# Patient Record
Sex: Female | Born: 1990 | Race: Asian | Hispanic: No | Marital: Single | Smoking: Never smoker
Health system: Southern US, Community
[De-identification: ages and names within clinical notes are randomized; demographics above are authoritative.]

## PROBLEM LIST (undated history)

## (undated) DIAGNOSIS — Z789 Other specified health status: Secondary | ICD-10-CM

## (undated) DIAGNOSIS — F32A Depression, unspecified: Secondary | ICD-10-CM

## (undated) DIAGNOSIS — D219 Benign neoplasm of connective and other soft tissue, unspecified: Secondary | ICD-10-CM

## (undated) DIAGNOSIS — R87629 Unspecified abnormal cytological findings in specimens from vagina: Secondary | ICD-10-CM

## (undated) DIAGNOSIS — A549 Gonococcal infection, unspecified: Secondary | ICD-10-CM

## (undated) DIAGNOSIS — A749 Chlamydial infection, unspecified: Secondary | ICD-10-CM

## (undated) DIAGNOSIS — Z202 Contact with and (suspected) exposure to infections with a predominantly sexual mode of transmission: Secondary | ICD-10-CM

## (undated) HISTORY — PX: THERAPEUTIC ABORTION: SHX798

## (undated) HISTORY — DX: Unspecified abnormal cytological findings in specimens from vagina: R87.629

## (undated) HISTORY — PX: NO PAST SURGERIES: SHX2092

## (undated) HISTORY — PX: WISDOM TOOTH EXTRACTION: SHX21

---
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
## 2020-08-06 NOTE — L&D Delivery Note (Addendum)
LABOR COURSE Patient presented to MAU after SROM at home on 9/11.  On arrival to L&D, cervical exam was 4/60/-2. She was initially managed expectantly with little change. Pitocin was started at 0730 on 9/12. She progressed to complete at 1615. She alternated laboring down and pushing for several hours but continued to progress and move baby.   Delivery Note Called to room and patient was complete and pushing. Head delivered OA. No nuchal cord present. Shoulder and body delivered in usual fashion. At [place] a healthy female was delivered via Vaginal, Spontaneous vertex presentation.  Infant with spontaneous cry, placed on mother's abdomen, dried and stimulated. Cord clamped x 2 after 1-minute delay, and cut by FOB. Cord blood drawn. Placenta delivered spontaneously with gentle cord traction. Appears intact. Fundus firm with massage and Pitocin. Labia, perineum, vagina, and cervix inspected with first degree perineal laceration which was repaired in the usual fashion.    APGAR: 8, 9; weight pending.   Cord: 3VC with the following complications:None.    Anesthesia: Nitrous oxide Episiotomy: None Lacerations: First degree perineal Suture Repair: 3.0 vicryl Est. Blood Loss (mL): 358  Mom to postpartum.  Baby to Couplet care / Skin to Skin.  Eppie Gibson, MD 04/18/21 1:17 AM    GME ATTESTATION:  I was present and gloved for the duration of the delivery and repair. I agree with the findings and the plan of care as documented in the resident's note.  Darrelyn Hillock, DO OB Fellow, [place] for [place] 04/18/2021 2:09 AM

## 2020-09-06 ENCOUNTER — Inpatient Hospital Stay (HOSPITAL_COMMUNITY): Payer: Medicaid Other

## 2020-09-06 ENCOUNTER — Other Ambulatory Visit: Payer: Self-pay

## 2020-09-06 ENCOUNTER — Inpatient Hospital Stay (HOSPITAL_COMMUNITY)
Admission: AD | Admit: 2020-09-06 | Discharge: 2020-09-06 | Disposition: A | Discharge status: Home / Self Care | Payer: Medicaid Other | Attending: Obstetrics and Gynecology | Admitting: Obstetrics and Gynecology

## 2020-09-06 ENCOUNTER — Encounter (HOSPITAL_COMMUNITY): Payer: Self-pay | Admitting: Obstetrics and Gynecology

## 2020-09-06 DIAGNOSIS — O4690 Antepartum hemorrhage, unspecified, unspecified trimester: Secondary | ICD-10-CM | POA: Diagnosis present

## 2020-09-06 DIAGNOSIS — Z3A01 Less than 8 weeks gestation of pregnancy: Secondary | ICD-10-CM | POA: Insufficient documentation

## 2020-09-06 DIAGNOSIS — O209 Hemorrhage in early pregnancy, unspecified: Secondary | ICD-10-CM | POA: Insufficient documentation

## 2020-09-06 DIAGNOSIS — Z6790 Unspecified blood type, Rh positive: Secondary | ICD-10-CM

## 2020-09-06 DIAGNOSIS — D251 Intramural leiomyoma of uterus: Secondary | ICD-10-CM | POA: Diagnosis not present

## 2020-09-06 DIAGNOSIS — O3411 Maternal care for benign tumor of corpus uteri, first trimester: Secondary | ICD-10-CM | POA: Diagnosis not present

## 2020-09-06 DIAGNOSIS — O4691 Antepartum hemorrhage, unspecified, first trimester: Secondary | ICD-10-CM

## 2020-09-06 DIAGNOSIS — Z3490 Encounter for supervision of normal pregnancy, unspecified, unspecified trimester: Secondary | ICD-10-CM

## 2020-09-06 HISTORY — DX: Other specified health status: Z78.9

## 2020-09-06 LAB — WET PREP, GENITAL
Clue Cells Wet Prep HPF POC: NONE SEEN
Sperm: NONE SEEN
Trich, Wet Prep: NONE SEEN
Yeast Wet Prep HPF POC: NONE SEEN

## 2020-09-06 LAB — CBC
HCT: 40.7 % (ref 36.0–46.0)
Hemoglobin: 13.2 g/dL (ref 12.0–15.0)
MCH: 28.6 pg (ref 26.0–34.0)
MCHC: 32.4 g/dL (ref 30.0–36.0)
MCV: 88.3 fL (ref 80.0–100.0)
Platelets: 251 10*3/uL (ref 150–400)
RBC: 4.61 MIL/uL (ref 3.87–5.11)
RDW: 13.6 % (ref 11.5–15.5)
WBC: 7.3 10*3/uL (ref 4.0–10.5)
nRBC: 0 % (ref 0.0–0.2)

## 2020-09-06 LAB — HCG, QUANTITATIVE, PREGNANCY: hCG, Beta Chain, Quant, S: 12768 m[IU]/mL — ABNORMAL HIGH (ref <5)

## 2020-09-06 LAB — URINALYSIS, ROUTINE W REFLEX MICROSCOPIC
Bilirubin Urine: NEGATIVE
Glucose, UA: NEGATIVE mg/dL
Hgb urine dipstick: NEGATIVE
Ketones, ur: NEGATIVE mg/dL
Leukocytes,Ua: NEGATIVE
Nitrite: NEGATIVE
Protein, ur: NEGATIVE mg/dL
Specific Gravity, Urine: 1.015 (ref 1.005–1.030)
pH: 6.5 (ref 5.0–8.0)

## 2020-09-06 LAB — ABO/RH: ABO/RH(D): O POS

## 2020-09-06 NOTE — Discharge Instructions (Signed)
Vaginal Bleeding During Pregnancy, First Trimester A small amount of bleeding from the vagina is common during early pregnancy. This kind of bleeding is also called spotting. Sometimes the bleeding is normal and does not cause problems. At other times, though, bleeding may be a sign of something serious. Normal bleeding in pregnancy can happen:  When the fertilized egg attaches itself to your womb.  When blood vessels change because of the pregnancy.  When you have pelvic exams.  When you have sex. Abnormal bleeding can happen:  When you have an infection.  When you have growths in your womb. The growths are called polyps.  If you are having a miscarriage or at risk of having one.  If you have other problems in your pregnancy. Tell your doctor right away about any bleeding from your vagina. Follow these instructions at home: Watch your bleeding  Watch your condition for any changes. Let your doctor know if you are worried about something.  Try to know what causes your bleeding. Ask yourself these questions: ? Does the bleeding start on its own? ? Does the bleeding start after something is done, such as sex or a pelvic exam?  Use a diary to write the things you see about your bleeding. Write in your diary: ? If the bleeding flows freely without stopping, or if it starts and stops, and then starts again. ? If the bleeding is heavy or light. ? How many pads you use in a day and how much blood is in them.  Tell your doctor if you pass tissue. He or she may want to see it.   Activity  Follow your doctor's instructions about how active you can be. Ask what activities are safe for you.  Do not have sex or orgasms until your doctor says that this is safe.  If needed, make plans for someone to help with your normal activities. General instructions  Take over-the-counter and prescription medicines only as told by your doctor.  Do not take aspirin because it can cause  bleeding.  Do not use tampons.  Do not douche.  Keep all follow-up visits. Contact a doctor if:  You have vaginal bleeding at any time while you are pregnant.  You have cramps.  You have a fever or chills. Get help right away if:  You have very bad cramps in your back or belly (abdomen).  You pass large clots or a lot of tissue from your vagina.  Your bleeding gets worse.  You feel light-headed.  You feel weak.  You pass out (faint).  You have chills.  You are leaking fluid from your vagina.  You have a gush of fluid from your vagina. Summary  Sometimes vaginal bleeding during pregnancy is normal and does not cause problems. At other times, bleeding may be a sign of something serious.  Tell your doctor right away about any bleeding from your vagina.  Follow your doctor's instructions about how active you can be. You may need someone to help you with your normal activities.  Keep all follow-up visits. This information is not intended to replace advice given to you by your health care provider. Make sure you discuss any questions you have with your health care provider. Document Revised: 04/14/2020 Document Reviewed: 04/14/2020 Elsevier Patient Education  2021 Elsevier Inc.  

## 2020-09-06 NOTE — MAU Note (Addendum)
Presents for evaluation of vaginal itching & odor.  States she has yeast infection and BV.  States has taken medication, but "no cure" Proof of pregnancy letter from [facility].

## 2020-09-06 NOTE — MAU Provider Note (Addendum)
History     CSN: [id number]  Arrival date and time: 09/06/20 1654  Chief Complaint  Patient presents with  . Vaginal Itching  . Vaginal Discharge  . Vaginal Odor   30 y.o. G1 @[redacted]w[redacted]d  by LMP presenting with spotting. She saw pink blood when she wiped around 7am today. No bleeding since. Denies abdominal pain. No recent sex.    OB History    Gravida  1   Para      Term      Preterm      AB      Living        SAB      IAB      Ectopic      Multiple      Live Births              Past Medical History:  Diagnosis Date  . Medical history non-contributory     Past Surgical History:  Procedure Laterality Date  . NO PAST SURGERIES      History reviewed. No pertinent family history.  Social History   Tobacco Use  . Smoking status: Never Smoker  . Smokeless tobacco: Never Used  Substance Use Topics  . Alcohol use: Never  . Drug use: Never    Allergies: No Known Allergies  Medications Prior to Admission  Medication Sig Dispense Refill Last Dose  . Prenatal Vit-Fe Fumarate-FA (PRENATAL VITAMINS PO) Take 1 tablet by mouth daily.   09/06/2020 at Unknown time    Review of Systems  Gastrointestinal: Negative for abdominal pain.  Genitourinary: Positive for vaginal bleeding.   Physical Exam   Blood pressure 115/79, pulse 85, temperature 98.3 F (36.8 C), temperature source Oral, resp. rate 19, height 5\' 2"  (1.575 m), weight 64.2 kg, last menstrual period 07/28/2020, SpO2 100 %.  Physical Exam Vitals and nursing note reviewed. Exam conducted with a chaperone present.  Constitutional:      General: She is not in acute distress.    Appearance: Normal appearance.  HENT:     Head: Normocephalic and atraumatic.  Cardiovascular:     Rate and Rhythm: Normal rate.  Pulmonary:     Effort: Pulmonary effort is normal. No respiratory distress.  Musculoskeletal:     Cervical back: Normal range of motion.  Neurological:     General: No focal deficit present.      Mental Status: She is alert and oriented to person, place, and time.  Psychiatric:        Mood and Affect: Mood normal.        Behavior: Behavior normal.    Results for orders placed or performed during the hospital encounter of 09/06/20 (from the past 24 hour(s))  Urinalysis, Routine w reflex microscopic Urine, Clean Catch     Status: None   Collection Time: 09/06/20  5:19 PM  Result Value Ref Range   Color, Urine YELLOW YELLOW   APPearance CLEAR CLEAR   Specific Gravity, Urine 1.015 1.005 - 1.030   pH 6.5 5.0 - 8.0   Glucose, UA NEGATIVE NEGATIVE mg/dL   Hgb urine dipstick NEGATIVE NEGATIVE   Bilirubin Urine NEGATIVE NEGATIVE   Ketones, ur NEGATIVE NEGATIVE mg/dL   Protein, ur NEGATIVE NEGATIVE mg/dL   Nitrite NEGATIVE NEGATIVE   Leukocytes,Ua NEGATIVE NEGATIVE  ABO/Rh     Status: None   Collection Time: 09/06/20  6:24 PM  Result Value Ref Range   ABO/RH(D) O POS    No rh immune globuloin  NOT A RH IMMUNE GLOBULIN CANDIDATE, PT RH POSITIVE Performed at [facility], [place] [address] [place], [place] [address]   CBC     Status: None   Collection Time: 09/06/20  6:24 PM  Result Value Ref Range   WBC 7.3 4.0 - 10.5 K/uL   RBC 4.61 3.87 - 5.11 MIL/uL   Hemoglobin 13.2 12.0 - 15.0 g/dL   HCT 40.7 36.0 - 46.0 %   MCV 88.3 80.0 - 100.0 fL   MCH 28.6 26.0 - 34.0 pg   MCHC 32.4 30.0 - 36.0 g/dL   RDW 13.6 11.5 - 15.5 %   Platelets 251 150 - 400 K/uL   nRBC 0.0 0.0 - 0.2 %  Wet prep, genital     Status: Abnormal   Collection Time: 09/06/20  7:05 PM  Result Value Ref Range   Yeast Wet Prep HPF POC NONE SEEN NONE SEEN   Trich, Wet Prep NONE SEEN NONE SEEN   Clue Cells Wet Prep HPF POC NONE SEEN NONE SEEN   WBC, Wet Prep HPF POC MANY (A) NONE SEEN   Sperm NONE SEEN     US OB LESS THAN 14 WEEKS WITH OB TRANSVAGINAL  Result Date: 09/06/2020 CLINICAL DATA:  Initial evaluation for acute vaginal bleeding, early pregnancy. EXAM: OBSTETRIC <14 WK [place] AND TRANSVAGINAL  OB US TECHNIQUE: Both transabdominal and transvaginal ultrasound examinations were performed for complete evaluation of the gestation as well as the maternal uterus, adnexal regions, and pelvic cul-de-sac. Transvaginal technique was performed to assess early pregnancy. COMPARISON:  None. FINDINGS: Intrauterine gestational sac: Single. Sac is somewhat irregular with a few angulated margins. Yolk sac:  Present. Embryo:  Negative. Cardiac Activity: Negative. Heart Rate: N/A MSD: 9 mm mm   5 w   5 d Subchorionic hemorrhage:  None visualized. Maternal uterus/adnexae: Ovaries within normal limits bilaterally. Small degenerating corpus luteal cyst noted within the left ovary. Intramural fibroid centered at the uterine fundus measures 3.9 x 3.8 x 3.6 cm. IMPRESSION: 1. Early intrauterine gestational sac with internal yolk sac, but no fetal pole or cardiac activity yet visualized. Recommend follow-up quantitative B-HCG levels and follow-up US in 14 days to confirm and assess viability. 2. 3.9 cm intramural fibroid at the uterine fundus. 3. Small degenerating right ovarian corpus luteal cyst. No other acute maternal uterine or adnexal abnormality. Electronically Signed   By: Jeannine Boga M.D.   On: 09/06/2020 18:54   MAU Course  Procedures  MDM Labs and [place] ordered and reviewed. Irregular IUGS and YS present, no FP. Discussed findings with pt, recommend f/u US in 2 weeks. No signs of yeast or BV, GC pending. Stable for discharge home.  Assessment and Plan   1. Early stage of pregnancy   2. Vaginal bleeding in pregnancy   3. Blood type, Rh positive    Discharge home Follow up at [facility] [place] in 2 weeks for [place] SAB precautions Pelvic rest  Allergies as of 09/06/2020   No Known Allergies     Medication List    TAKE these medications   PRENATAL VITAMINS PO Take 1 tablet by mouth daily.      Julianne Handler, CNM 09/06/2020, 7:38 PM

## 2020-09-06 NOTE — MAU Note (Signed)
Last Wednesday had pregnancy with home pregnancy test. Today after BM pt noticed pinkish re discharge on toilet tissue. Pt states she has BV and [facility] Prescribed medication that she completed but states she is still having itching and foul odor. Pt denies uterine cramping. Concerned that she is miscarrying.Marland Kitchen

## 2020-09-07 LAB — GC/CHLAMYDIA PROBE AMP (~~LOC~~) NOT AT ARMC
Chlamydia: NEGATIVE
Comment: NEGATIVE
Comment: NORMAL
Neisseria Gonorrhea: NEGATIVE

## 2020-09-21 ENCOUNTER — Encounter: Payer: Self-pay | Admitting: Medical

## 2020-09-21 ENCOUNTER — Telehealth: Payer: Self-pay | Admitting: Medical

## 2020-09-21 ENCOUNTER — Other Ambulatory Visit: Payer: Self-pay

## 2020-09-21 ENCOUNTER — Ambulatory Visit
Admission: RE | Admit: 2020-09-21 | Discharge: 2020-09-21 | Disposition: A | Discharge status: Home / Self Care | Payer: Medicaid Other | Source: Ambulatory Visit | Attending: Certified Nurse Midwife | Admitting: Certified Nurse Midwife

## 2020-09-21 DIAGNOSIS — D259 Leiomyoma of uterus, unspecified: Secondary | ICD-10-CM | POA: Insufficient documentation

## 2020-09-21 DIAGNOSIS — O4690 Antepartum hemorrhage, unspecified, unspecified trimester: Secondary | ICD-10-CM | POA: Diagnosis not present

## 2020-09-21 DIAGNOSIS — O3410 Maternal care for benign tumor of corpus uteri, unspecified trimester: Secondary | ICD-10-CM | POA: Insufficient documentation

## 2020-09-21 HISTORY — DX: Leiomyoma of uterus, unspecified: D25.9

## 2020-09-21 NOTE — Telephone Encounter (Signed)
I called Heather Brock today at 4:45 PM and confirmed patient's identity using two patient identifiers. [place] results from earlier today were reviewed. Patient is scheduled for new OB visit at [facility] on 10/04/20. First trimester warning signs reviewed. Patient voiced understanding and had no further questions.   US OB Transvaginal  Result Date: 09/21/2020 CLINICAL DATA:  30 year old pregnant female with vaginal bleeding, presenting for assessment viability. Quantitative beta HCG 12,768 on 09/06/2020. EDC by LMP: 05/04/2021, projecting to an expected gestational age of [redacted] weeks 6 days. EXAM: TRANSVAGINAL OB ULTRASOUND TECHNIQUE: Transvaginal ultrasound was performed for complete evaluation of the gestation as well as the maternal uterus, adnexal regions, and pelvic cul-de-sac. COMPARISON:  09/06/2020 obstetric scan. FINDINGS: Intrauterine gestational sac: Single Yolk sac:  Visualized. Embryo:  Visualized. Cardiac Activity: Visualized. Heart Rate: 150 bpm CRL:   12.6 mm   7 w 4 d                  [place] EDC: 05/06/2021 Subchorionic hemorrhage:  None visualized. Maternal uterus/adnexae: Anteverted uterus with 4.4 x 3.3 x 4.2 cm intramural posterior fundal fibroid. Right ovary measures 3.0 x 1.4 x 2.7 cm and is normal. Left ovary measures 3.9 x 2.0 x 2.7 cm and contains a corpus luteum. No abnormal adnexal masses. No abnormal free fluid. IMPRESSION: 1. Single living intrauterine gestation at 7 weeks 4 days by crown-rump length, concordant with provided menstrual dating. No acute first-trimester gestational abnormality. 2. Intramural 4.4 cm posterior fundal uterine fibroid. Electronically Signed   By: Ilona Sorrel M.D.   On: 09/21/2020 13:45    Kerry Hough, PA-C 09/21/2020 4:45 PM

## 2020-10-17 ENCOUNTER — Other Ambulatory Visit: Payer: Self-pay

## 2020-10-17 ENCOUNTER — Encounter (HOSPITAL_COMMUNITY): Payer: Self-pay | Admitting: Obstetrics and Gynecology

## 2020-10-17 ENCOUNTER — Inpatient Hospital Stay (HOSPITAL_COMMUNITY)
Admission: AD | Admit: 2020-10-17 | Discharge: 2020-10-17 | Disposition: A | Discharge status: Home / Self Care | Payer: Medicaid Other | Attending: Obstetrics and Gynecology | Admitting: Obstetrics and Gynecology

## 2020-10-17 DIAGNOSIS — O4691 Antepartum hemorrhage, unspecified, first trimester: Secondary | ICD-10-CM

## 2020-10-17 DIAGNOSIS — O209 Hemorrhage in early pregnancy, unspecified: Secondary | ICD-10-CM | POA: Insufficient documentation

## 2020-10-17 DIAGNOSIS — D259 Leiomyoma of uterus, unspecified: Secondary | ICD-10-CM | POA: Diagnosis not present

## 2020-10-17 DIAGNOSIS — O3411 Maternal care for benign tumor of corpus uteri, first trimester: Secondary | ICD-10-CM | POA: Insufficient documentation

## 2020-10-17 DIAGNOSIS — B9689 Other specified bacterial agents as the cause of diseases classified elsewhere: Secondary | ICD-10-CM | POA: Insufficient documentation

## 2020-10-17 DIAGNOSIS — M549 Dorsalgia, unspecified: Secondary | ICD-10-CM

## 2020-10-17 DIAGNOSIS — Z3A11 11 weeks gestation of pregnancy: Secondary | ICD-10-CM

## 2020-10-17 DIAGNOSIS — N760 Acute vaginitis: Secondary | ICD-10-CM

## 2020-10-17 DIAGNOSIS — O23591 Infection of other part of genital tract in pregnancy, first trimester: Secondary | ICD-10-CM | POA: Insufficient documentation

## 2020-10-17 DIAGNOSIS — Z3491 Encounter for supervision of normal pregnancy, unspecified, first trimester: Secondary | ICD-10-CM

## 2020-10-17 DIAGNOSIS — O3410 Maternal care for benign tumor of corpus uteri, unspecified trimester: Secondary | ICD-10-CM

## 2020-10-17 DIAGNOSIS — O99891 Other specified diseases and conditions complicating pregnancy: Secondary | ICD-10-CM

## 2020-10-17 HISTORY — DX: Benign neoplasm of connective and other soft tissue, unspecified: D21.9

## 2020-10-17 HISTORY — DX: Gonococcal infection, unspecified: A54.9

## 2020-10-17 HISTORY — DX: Depression, unspecified: F32.A

## 2020-10-17 HISTORY — DX: Chlamydial infection, unspecified: A74.9

## 2020-10-17 HISTORY — DX: Contact with and (suspected) exposure to infections with a predominantly sexual mode of transmission: Z20.2

## 2020-10-17 LAB — URINALYSIS, ROUTINE W REFLEX MICROSCOPIC
Bilirubin Urine: NEGATIVE
Glucose, UA: NEGATIVE mg/dL
Ketones, ur: NEGATIVE mg/dL
Leukocytes,Ua: NEGATIVE
Nitrite: NEGATIVE
Protein, ur: NEGATIVE mg/dL
Specific Gravity, Urine: 1.019 (ref 1.005–1.030)
pH: 7 (ref 5.0–8.0)

## 2020-10-17 LAB — WET PREP, GENITAL
Sperm: NONE SEEN
Trich, Wet Prep: NONE SEEN
Yeast Wet Prep HPF POC: NONE SEEN

## 2020-10-17 MED ORDER — METRONIDAZOLE 500 MG PO TABS: 500.0000 mg | ORAL_TABLET | Freq: Two times a day (BID) | ORAL | 0 refills | Status: DC

## 2020-10-17 NOTE — MAU Note (Signed)
Was here in Feb for bleeding, started again today.(picture of blood in toilet).  Had sex this morning prior to the bleeding.  Has a d/c with odor.  Has pain in low back, hurts when she sits a long time, hurts to walk.

## 2020-10-17 NOTE — Discharge Instructions (Signed)
Vaginal Bleeding During Pregnancy, First Trimester A small amount of bleeding from the vagina, or spotting, is common during early pregnancy. Some bleeding may be related to the pregnancy, and some may not. In many cases, the bleeding is normal and is not a problem. However, bleeding can also be a sign of something serious. Normal things that may cause bleeding during the first trimester:  Implantation of the fertilized egg in the lining of the uterus.  Rapid changes in blood vessels. This is caused by changes that are happening to the body during pregnancy.  Sex.  Pelvic exams. Abnormal things that may cause bleeding during the first trimester include:  Infection or inflammation of the cervix.  Growths or polyps on the cervix.  Miscarriage or threatened miscarriage.  Pregnancy that is growing outside of the uterus (ectopic pregnancy).  A fertilized egg that becomes a mass of tissue (molar pregnancy). Tell your health care provider right away if there is any bleeding from your vagina. Follow these instructions at home: Monitoring your bleeding Monitor your bleeding.  Pay attention to any changes in your symptoms. Let your health care provider know about any concerns.  Try to understand when the bleeding occurs. Does the bleeding start on its own, or does it start after something is done, such as sex or a pelvic exam?  Use a diary to record the things you see about your bleeding, including: ? The kind of bleeding you are having. Does the bleeding start and stop irregularly, or is it a constant flow? ? The severity of your bleeding. Is the bleeding heavy or light? ? The number of pads you use each day, how often you change them, and how soaked they are.  Tell your health care provider if you pass tissue. He or she may want to see it.   Activity  Follow instructions from your health care provider about limiting your activity. Ask what activities are safe for you.  Do not have  sex until your health care provider says that this is safe.  If needed, make plans for someone to help with your regular activities. General instructions  Take over-the-counter and prescription medicines only as told by your health care provider.  Do not take aspirin because it can cause bleeding.  Do not use tampons or douche.  Keep all follow-up visits. This is important. Contact a health care provider if:  You have vaginal bleeding during any part of your pregnancy.  You have cramps or labor pains.  You have a fever or chills. Get help right away if:  You have severe cramps in your back or abdomen.  You pass large clots or a large amount of tissue from your vagina.  Your bleeding increases.  You feel light-headed or weak, or you faint.  You are leaking fluid or have a gush of fluid from your vagina. Summary  A small amount of bleeding from the vagina is common during early pregnancy.  Be sure to tell your health care provider about any vaginal bleeding right away.  Try to understand when bleeding occurs. Does bleeding occur on its own, or does it occur after something is done, such as sex or pelvic exams?  Keep all follow-up visits. This is important. This information is not intended to replace advice given to you by your health care provider. Make sure you discuss any questions you have with your health care provider. Document Revised: 04/14/2020 Document Reviewed: 04/14/2020 Elsevier Patient Education  2021 Elsevier Inc.  

## 2020-10-17 NOTE — MAU Provider Note (Signed)
History     CSN: [id number]  Arrival date and time: 10/17/20 1152   Event Date/Time   First Provider Initiated Contact with Patient 10/17/20 1347      Chief Complaint  Patient presents with  . Vaginal Bleeding  . Back Pain  . Vaginal Discharge   HPI Heather Brock is a 30 y.o. G3P0020 at [redacted]w[redacted]d who presents with vaginal bleeding and back pain. Reports vaginal bleeding started this morning after intercourse. Dark red blood in the toilet. Not saturating pads or passing clots. Also complains of low back pain. Pain is worse with prolonged sitting and walking. Rates pain 7/10. Hasn't treated symptoms. Also reports a foul smelling mucoid discharge. No vaginal itching or irritation. Goes to [facility] for prenatal care.   OB History    Gravida  3   Para      Term      Preterm      AB  2   Living  0     SAB      IAB  2   Ectopic      Multiple      Live Births  0           Past Medical History:  Diagnosis Date  . Medical history non-contributory     Past Surgical History:  Procedure Laterality Date  . NO PAST SURGERIES      No family history on file.  Social History   Tobacco Use  . Smoking status: Never Smoker  . Smokeless tobacco: Never Used  Substance Use Topics  . Alcohol use: Never  . Drug use: Never    Allergies:  Allergies  Allergen Reactions  . Latex     Irritation with condoms    Medications Prior to Admission  Medication Sig Dispense Refill Last Dose  . Prenatal Vit-Fe Fumarate-FA (PRENATAL VITAMINS PO) Take 1 tablet by mouth daily.       Review of Systems  Constitutional: Negative.   Gastrointestinal: Negative.   Genitourinary: Positive for vaginal bleeding and vaginal discharge. Negative for dysuria and flank pain.  Musculoskeletal: Positive for back pain.   Physical Exam   Blood pressure 120/78, pulse 91, temperature 98.1 F (36.7 C), temperature source Oral, resp. rate 18, height 5\' 2"  (1.575 m), weight 64.7 kg, last menstrual period  07/28/2020, SpO2 99 %.  Physical Exam Vitals and nursing note reviewed. Exam conducted with a chaperone present.  Constitutional:      General: She is not in acute distress.    Appearance: Normal appearance.  HENT:     Head: Normocephalic and atraumatic.  Pulmonary:     Effort: Pulmonary effort is normal. No respiratory distress.  Abdominal:     General: Abdomen is flat.     Tenderness: There is no abdominal tenderness.  Genitourinary:    General: Normal vulva.     Cervix: No cervical motion tenderness.     Comments: Small amount of dark red blood. Cervix closed.  Neurological:     Mental Status: She is alert.     MAU Course  Procedures Results for orders placed or performed during the hospital encounter of 10/17/20 (from the past 24 hour(s))  Urinalysis, Routine w reflex microscopic Urine, Clean Catch     Status: Abnormal   Collection Time: 10/17/20 12:30 PM  Result Value Ref Range   Color, Urine YELLOW YELLOW   APPearance HAZY (A) CLEAR   Specific Gravity, Urine 1.019 1.005 - 1.030   pH 7.0 5.0 - 8.0  Glucose, UA NEGATIVE NEGATIVE mg/dL   Hgb urine dipstick SMALL (A) NEGATIVE   Bilirubin Urine NEGATIVE NEGATIVE   Ketones, ur NEGATIVE NEGATIVE mg/dL   Protein, ur NEGATIVE NEGATIVE mg/dL   Nitrite NEGATIVE NEGATIVE   Leukocytes,Ua NEGATIVE NEGATIVE   WBC, UA 0-5 0 - 5 WBC/hpf   Bacteria, UA RARE (A) NONE SEEN   Squamous Epithelial / LPF 0-5 0 - 5   Mucus PRESENT   Wet prep, genital     Status: Abnormal   Collection Time: 10/17/20  1:55 PM   Specimen: Vaginal  Result Value Ref Range   Yeast Wet Prep HPF POC NONE SEEN NONE SEEN   Trich, Wet Prep NONE SEEN NONE SEEN   Clue Cells Wet Prep HPF POC PRESENT (A) NONE SEEN   WBC, Wet Prep HPF POC MANY (A) NONE SEEN   Sperm NONE SEEN     MDM FHT present via doppler. Minimal bleeding on exam & cervix closed.  Wet prep & GC/CT collected per patient request & due to new onset of malodorous discharge. Wet prep positive for  clue cells, will tx with flagyl.    Assessment and Plan   1. Vaginal bleeding in pregnancy, first trimester   2. Uterine fibroid during pregnancy, antepartum   3. Fetal heart tones present, first trimester   4. Bacterial vaginosis   5. [redacted] weeks gestation of pregnancy    -bleeding precautions -pelvic rest -rx flagyl -GC/CT & urine culture pending  Jorje Guild 10/17/2020, 1:48 PM

## 2020-10-18 LAB — CULTURE, OB URINE: Culture: <10000 — AB

## 2020-10-18 LAB — GC/CHLAMYDIA PROBE AMP (~~LOC~~) NOT AT ARMC
Chlamydia: NEGATIVE
Comment: NEGATIVE
Comment: NORMAL
Neisseria Gonorrhea: NEGATIVE

## 2020-10-21 ENCOUNTER — Inpatient Hospital Stay (HOSPITAL_COMMUNITY)
Admission: AD | Admit: 2020-10-21 | Discharge: 2020-10-21 | Disposition: A | Discharge status: Home / Self Care | Payer: Medicaid Other | Attending: Obstetrics and Gynecology | Admitting: Obstetrics and Gynecology

## 2020-10-21 ENCOUNTER — Other Ambulatory Visit: Payer: Self-pay

## 2020-10-21 ENCOUNTER — Encounter (HOSPITAL_COMMUNITY): Payer: Self-pay | Admitting: Obstetrics and Gynecology

## 2020-10-21 DIAGNOSIS — O99891 Other specified diseases and conditions complicating pregnancy: Secondary | ICD-10-CM

## 2020-10-21 DIAGNOSIS — O4691 Antepartum hemorrhage, unspecified, first trimester: Secondary | ICD-10-CM

## 2020-10-21 DIAGNOSIS — Z3A12 12 weeks gestation of pregnancy: Secondary | ICD-10-CM

## 2020-10-21 DIAGNOSIS — N760 Acute vaginitis: Secondary | ICD-10-CM

## 2020-10-21 DIAGNOSIS — Z3491 Encounter for supervision of normal pregnancy, unspecified, first trimester: Secondary | ICD-10-CM

## 2020-10-21 DIAGNOSIS — B9689 Other specified bacterial agents as the cause of diseases classified elsewhere: Secondary | ICD-10-CM

## 2020-10-21 DIAGNOSIS — N939 Abnormal uterine and vaginal bleeding, unspecified: Secondary | ICD-10-CM

## 2020-10-21 DIAGNOSIS — F419 Anxiety disorder, unspecified: Secondary | ICD-10-CM

## 2020-10-21 DIAGNOSIS — O99341 Other mental disorders complicating pregnancy, first trimester: Secondary | ICD-10-CM

## 2020-10-21 NOTE — MAU Provider Note (Signed)
Event Date/Time   First Provider Initiated Contact with Patient 10/21/20 1024     S [name] is a 30 y.o. G52P0020 pregnant female at [redacted]w[redacted]d who presents to MAU today with complaint of continued vaginal spotting - only when she wipes after using the bathroom. She was seen on Monday for the same thing but had +FHT and diagnosed with BV. Pt says she was told to come back in a week if bleeding had stopped, MAU note states bleeding precautions given. Had appt at [facility] for initial OB, but was unable to make it due to transportation issues. Expresses anxiety r/t family problems and continued spotting. Did not begin taking flagyl for BV until Wednesday and not taking it consistently. Stated "I keep making myself crazy because every time I see blood I Google 'signs of miscarriage' and then I called the office and they sent me here." Denies nausea/vomiting/diarrhea, cramping or other physical symptoms.  O BP 117/75 (BP Location: Right Arm)   Pulse 89   Temp 98.4 F (36.9 C) (Oral)   Resp 18   Ht 5\' 2"  (1.575 m)   Wt 139 lb 6.4 oz (63.2 kg)   LMP 07/28/2020   SpO2 100%   BMI 25.50 kg/m  Physical Exam Vitals and nursing note reviewed.  Constitutional:      General: She is not in acute distress.    Appearance: She is not ill-appearing.  HENT:     Mouth/Throat:     Mouth: Mucous membranes are moist.  Eyes:     Pupils: Pupils are equal, round, and reactive to light.  Cardiovascular:     Rate and Rhythm: Normal rate.     Pulses: Normal pulses.  Pulmonary:     Effort: Pulmonary effort is normal.  Musculoskeletal:        General: Normal range of motion.  Skin:    General: Skin is warm and dry.     Capillary Refill: Capillary refill takes less than 2 seconds.  Neurological:     Mental Status: She is alert and oriented to person, place, and time.  Psychiatric:        Behavior: Behavior normal.        Thought Content: Thought content normal.        Judgment: Judgment normal.     Comments:  Anxious/nervous    FHR: 163  - RN found FHT and then I had pt record a video of the FHR so she can watch the video instead of Googling.  A Vaginal spotting r/t bacterial vaginosis Fetal heart tones present at 12 weeks of gestation  P Discharge from MAU in stable condition with bleeding precautions  - reinforced that pt should not follow up here unless soaking a pad, severe abdominal pain, etc Encouraged pt to seek care at [facility] so she can take advantage of on-site [place], [employer], and Film/video editor. Sent message to admin for scheduling assistance.  Gabriel Carina, [place] 10/21/2020 10:51 AM

## 2020-10-21 NOTE — Discharge Instructions (Signed)
Bacterial Vaginosis  Bacterial vaginosis is an infection of the vagina. It happens when too many normal germs (healthy bacteria) grow in the vagina. This infection can make it easier to get other infections from sex (STIs). It is very important for pregnant women to get treated. This infection can cause babies to be born early or at a low birth weight. What are the causes? This infection is caused by an increase in certain germs that grow in the vagina. You cannot get this infection from toilet seats, bedsheets, swimming pools, or things that touch your vagina. What increases the risk?  Having sex with a new person or more than one person.  Having sex without protection.  Douching.  Having an intrauterine device (IUD).  Smoking.  Using drugs or drinking alcohol. These can lead you to do things that are risky.  Taking certain antibiotic medicines.  Being pregnant. What are the signs or symptoms? Some women have no symptoms. Symptoms may include:  A discharge from your vagina. It may be gray or white. It can be watery or foamy.  A fishy smell. This can happen after sex or during your menstrual period.  Itching in and around your vagina.  A feeling of burning or pain when you pee (urinate). How is this treated? This infection is treated with antibiotic medicines. These may be given to you as:  A pill.  A cream for your vagina.  A medicine that you put into your vagina (suppository). If the infection comes back after treatment, you may need more antibiotics. Follow these instructions at home: Medicines  Take over-the-counter and prescription medicines as told by your doctor.  Take or use your antibiotic medicine as told by your doctor. Do not stop taking or using it, even if you start to feel better. General instructions  If the person you have sex with is a woman, tell her that you have this infection. She will need to follow up with her doctor. If you have a female  partner, he does not need to be treated.  Do not have sex until you finish treatment.  Drink enough fluid to keep your pee pale yellow.  Keep your vagina and butt clean. ? Wash the area with warm water each day. ? Wipe from front to back after you use the toilet.  If you are breastfeeding a baby, ask your doctor if you should keep doing so during treatment.  Keep all follow-up visits. How is this prevented? Self-care  Do not douche.  Use only warm water to wash around your vagina.  Wear underwear that is cotton or lined with cotton.  Do not wear tight pants and pantyhose, especially in the summer. Safe sex  Use protection when you have sex. This includes: ? Use condoms. ? Use dental dams. This is a thin layer that protects the mouth during oral sex.  Limit how many people you have sex with. To prevent this infection, it is best to have sex with just one person.  Get tested for STIs. The person you have sex with should also get tested. Drugs and alcohol  Do not smoke or use any products that contain nicotine or tobacco. If you need help quitting, ask your doctor.  Do not use drugs.  Do not drink alcohol if: ? Your doctor tells you not to drink. ? You are pregnant, may be pregnant, or are planning to become pregnant.  If you drink alcohol: ? Limit how much you have to 0-1 drink   a day. ? Know how much alcohol is in your drink. In the [place] one drink equals one 12 oz bottle of beer (355 mL), one 5 oz glass of wine (148 mL), or one 1 oz glass of hard liquor (44 mL). Where to find more information  Centers for Disease Control and Prevention: www.cdc.gov  American Sexual Health Association: www.ashastd.org  Office on Women's Health: www.womenshealth.gov Contact a doctor if:  Your symptoms do not get better, even after you are treated.  You have more discharge or pain when you pee.  You have a fever or chills.  You have pain in your belly (abdomen) or in the area  between your hips.  You have pain with sex.  You bleed from your vagina between menstrual periods. Summary  This infection can happen when too many germs (bacteria) grow in the vagina.  This infection can make it easier to get infections from sex (STIs). Treating this can lower that chance.  Get treated if you are pregnant. This infection can cause babies to be born early.  Do not stop taking or using your antibiotic medicine, even if you start to feel better. This information is not intended to replace advice given to you by your health care provider. Make sure you discuss any questions you have with your health care provider. Document Revised: 01/21/2020 Document Reviewed: 01/21/2020 Elsevier Patient Education  2021 [employer] Inc.  

## 2020-10-21 NOTE — MAU Note (Signed)
Presents with c/o VB and passing that began Monday, states was seen in MAU on Monday and instructed to return in 1 week if VB continued.  Reports no longer passing clots, but spotting continues.  Currently being treated for BV, started taking it Wednesday.

## 2020-11-11 ENCOUNTER — Ambulatory Visit: Payer: Self-pay | Admitting: Clinical

## 2020-11-11 DIAGNOSIS — Z5329 Procedure and treatment not carried out because of patient's decision for other reasons: Secondary | ICD-10-CM

## 2020-11-11 DIAGNOSIS — Z91199 Patient's noncompliance with other medical treatment and regimen due to unspecified reason: Secondary | ICD-10-CM

## 2020-11-11 NOTE — BH Specialist Note (Addendum)
Pt did not arrive to video visit and did not answer the phone ;Left HIPPA-compliant message to call back Roselyn Reef from [facility] [employer] [facility] at [phone] (main office) or [phone] ([place] office). ; could not leave MyChart message for patient.   Heather Brock (Supervisor: Vesta Mixer)

## 2020-11-14 ENCOUNTER — Encounter: Payer: Medicaid Other | Admitting: Obstetrics and Gynecology

## 2020-11-17 ENCOUNTER — Encounter: Payer: Self-pay | Admitting: Obstetrics and Gynecology

## 2020-11-17 ENCOUNTER — Other Ambulatory Visit (HOSPITAL_COMMUNITY)
Admission: RE | Admit: 2020-11-17 | Discharge: 2020-11-17 | Disposition: A | Discharge status: Home / Self Care | Payer: Medicaid Other | Source: Ambulatory Visit | Attending: Obstetrics and Gynecology | Admitting: Obstetrics and Gynecology

## 2020-11-17 ENCOUNTER — Other Ambulatory Visit: Payer: Self-pay

## 2020-11-17 ENCOUNTER — Ambulatory Visit (INDEPENDENT_AMBULATORY_CARE_PROVIDER_SITE_OTHER): Payer: Self-pay | Admitting: Obstetrics and Gynecology

## 2020-11-17 VITALS — BP 106/72 | HR 91 | Wt 146.4 lb

## 2020-11-17 DIAGNOSIS — D259 Leiomyoma of uterus, unspecified: Secondary | ICD-10-CM

## 2020-11-17 DIAGNOSIS — F419 Anxiety disorder, unspecified: Secondary | ICD-10-CM

## 2020-11-17 DIAGNOSIS — Z3480 Encounter for supervision of other normal pregnancy, unspecified trimester: Secondary | ICD-10-CM | POA: Diagnosis not present

## 2020-11-17 DIAGNOSIS — Z3493 Encounter for supervision of normal pregnancy, unspecified, third trimester: Secondary | ICD-10-CM | POA: Insufficient documentation

## 2020-11-17 DIAGNOSIS — Z3A16 16 weeks gestation of pregnancy: Secondary | ICD-10-CM | POA: Insufficient documentation

## 2020-11-17 DIAGNOSIS — O219 Vomiting of pregnancy, unspecified: Secondary | ICD-10-CM | POA: Insufficient documentation

## 2020-11-17 DIAGNOSIS — O3410 Maternal care for benign tumor of corpus uteri, unspecified trimester: Secondary | ICD-10-CM

## 2020-11-17 NOTE — Progress Notes (Signed)
INITIAL PRENATAL VISIT NOTE  Subjective:  Heather Brock is a 30 y.o. G3P0020 at [redacted]w[redacted]d by LMP being seen today for her initial prenatal visit.  She has an obstetric history significant for TAB x 2. She has a medical history significant for 4cm uterine fibroid.  Patient reports extreme emotional swings with occasional crying over small things, but does not notice depression.  Contractions: Not present. Vag. Bleeding: None.  Movement: Absent. Denies leaking of fluid.    Past Medical History:  Diagnosis Date  . Chlamydia   . Depression    when younger, ok now  . Fibroid   . Gonorrhea   . Medical history non-contributory   . Trichomonas exposure     Past Surgical History:  Procedure Laterality Date  . NO PAST SURGERIES    . THERAPEUTIC ABORTION      OB History  Gravida Para Term Preterm AB Living  3       2 0  SAB IAB Ectopic Multiple Live Births    2     0    # Outcome Date GA Lbr Len/2nd Weight Sex Delivery Anes PTL Lv  3 Current           2 IAB           1 IAB             Social History   Socioeconomic History  . Marital status: Single    Spouse name: Not on file  . Number of children: Not on file  . Years of education: Not on file  . Highest education level: Not on file  Occupational History  . Not on file  Tobacco Use  . Smoking status: Former Smoker    Types: Cigarettes  . Smokeless tobacco: Never Used  . Tobacco comment: 2 packs /day- quit 2015  Vaping Use  . Vaping Use: Never used  Substance and Sexual Activity  . Alcohol use: Never  . Drug use: Not Currently    Types: Marijuana    Comment: stopped in Jan 2022  . Sexual activity: Yes    Birth control/protection: None  Other Topics Concern  . Not on file  Social History Narrative  . Not on file   Social Determinants of Health   Financial Resource Strain: Not on file  Food Insecurity: Not on file  Transportation Needs: Not on file  Physical Activity: Not on file  Stress: Not on file  Social  Connections: Not on file    Family History  Problem Relation Age of Onset  . Hyperlipidemia Father   . Hypertension Father      Current Outpatient Medications:  .  Prenatal Vit-Fe Fumarate-FA (PRENATAL VITAMINS PO), Take 1 tablet by mouth daily., Disp: , Rfl:  .  metroNIDAZOLE (FLAGYL) 500 MG tablet, Take 1 tablet (500 mg total) by mouth 2 (two) times daily., Disp: 14 tablet, Rfl: 0  Allergies  Allergen Reactions  . Latex     Irritation with condoms    Review of Systems: Negative except for what is mentioned in HPI.  Objective:   Vitals:   11/17/20 1415  BP: 106/72  Pulse: 91  Weight: 146 lb 6.4 oz (66.4 kg)    Fetal Status: Fetal Heart Rate (bpm): 143   Movement: Absent     Physical Exam: BP 106/72   Pulse 91   Wt 146 lb 6.4 oz (66.4 kg)   LMP 07/28/2020   BMI 26.78 kg/m  CONSTITUTIONAL: Well-developed, well-nourished female in  no acute distress.  NEUROLOGIC: Alert and oriented to person, place, and time. Normal reflexes, muscle tone coordination. No cranial nerve deficit noted. PSYCHIATRIC: Normal mood and affect. Normal behavior. Normal judgment and thought content. SKIN: Skin is warm and dry. No rash noted. Not diaphoretic. No erythema. No pallor. HENT:  Normocephalic, atraumatic, External right and left ear normal. Oropharynx is clear and moist EYES: Conjunctivae and EOM are normal.  No scleral icterus.  NECK: Normal range of motion, supple, no masses CARDIOVASCULAR: Normal heart rate noted, regular rhythm RESPIRATORY: Effort and breath sounds normal, no problems with respiration noted BREASTS: symmetric, non-tender, no masses palpable, left breast with slightly inverted nipple ABDOMEN: Soft, nontender, nondistended, gravid. GU: normal appearing external female genitalia, nulliparous normal appearing cervix, scant white discharge in vagina, no lesions noted, pap taken, cervix slightly friable with spotting noted after pap Bimanual: 16 weeks sized uterus, no  adnexal tenderness or palpable lesions noted MUSCULOSKELETAL: Normal range of motion. EXT:  No edema and no tenderness. 2+ distal pulses.   Assessment and Plan:  Pregnancy: G3P0020 at [redacted]w[redacted]d by LMP c/w u/s  1. Supervision of other normal pregnancy, antepartum  - CBC/D/Plt+RPR+Rh+ABO+Rub Ab... - [facility] AMB BABYSCRIPTS SCHEDULE OPTIMIZATION - Culture, OB Urine - [place] MFM OB COMP + 14 WK; Future - Hemoglobin A1c - Cytology - PAP( [place])  2. [redacted] weeks gestation of pregnancy   3. Nausea and vomiting during pregnancy Pt desires to take ginger prenatals at this time  4. Uterine fibroid during pregnancy, antepartum  intramural fibroid  5. Anxiety  - Ambulatory referral to [place]   Preterm labor symptoms and general obstetric precautions including but not limited to vaginal bleeding, contractions, leaking of fluid and fetal movement were reviewed in detail with the patient.  Please refer to After Visit Summary for other counseling recommendations.   Return in about 4 weeks (around 12/15/2020) for [facility], in person.  Griffin Basil 11/17/2020 3:29 PM

## 2020-11-17 NOTE — Patient Instructions (Signed)
AREA PEDIATRIC/FAMILY PRACTICE PHYSICIANS  [place] ([address]) . [facility] o Chambliss, MD; Eniola, MD; Hale, MD; Hensel, MD; McDiarmid, MD; McIntyer, MD; Joanathan Affeldt, MD; Walden, MD o [address] [place], [place] [address] o [phone] o Mon-Fri 8:30-12:30, 1:30-5:00 o Providers come to see babies at [facility] o Accepting Medicaid . [facility] o Limited providers who accept newborns: Koirala, MD; Morrow, MD; Wolters, MD o [address], [place], [place] [address] o [phone] o Mon-Fri 8:00-5:30 o Babies seen by providers at [facility] o Does NOT accept Medicaid o Please call early in hospitalization for appointment (limited availability)  . [facility] o Mulberry, MD o [address] [place], [place] [address] o [phone] o Mon, Tue, Thur, Fri 8:30-5:00, Wed 10:00-7:00 (closed 1-2pm) o Babies seen by [facility] providers o Accepting Medicaid . [facility] - Pediatrician o Rubin, MD o [address], [place], [place] [address] o [phone] o Mon-Fri 8:30-5:00, Sat 8:30-12:00 o Provider comes to see babies at [facility] o Accepting Medicaid o Must have been referred from current patients or contacted office prior to delivery . [facility] ([facility]) o Brown, MD; Chandler, MD; Ettefagh, MD; Grant, MD; Lester, MD; McCormick, MD; McQueen, MD; Prose, MD; Simha, MD; Stanley, MD; Stryffeler, NP; Tebben, NP o [address], [place], [place] [address] o [phone] o Mon, Tue, Thur, Fri 8:30-5:30, Wed 9:30-5:30, Sat 8:30-12:30 o Babies seen by [facility] providers o Accepting Medicaid o Only accepting infants of first-time parents or siblings of current patients o Hospital discharge coordinator will make follow-up appointment . Jack Amos o [address],  [place], [place]  [address] o [phone]   Fax - [phone] . [facility] o [address], [place], [place]  [address] o Phone - [phone]   Fax - [phone] . Shilpa Gosrani o [address], [place], [place]  [address] o [phone]  [place] ([address]) . [facility] o Bates, MD; Brassfield, MD; Cooper, Cox, MD; MD; Davis, MD; Dovico, MD; Ettefaugh, MD; Little, MD; Lowe, MD; Keiffer, MD; Melvin, MD; Sumner, MD; Williams, MD o [address], [place], [place] [address] o [phone] o Mon-Fri 8:30-5:00 (extended evenings Mon-Thur as needed), Sat-Sun 10:00-1:00 o Providers come to see babies at [facility] o Accepting Medicaid for families of first-time babies and families with all children in the household age 3 and under. Must register with office prior to making appointment (M-F only). . [facility] o Henson, NP; Knapp, MD; Lalonde, MD; Tysinger, PA o [address] [place], [place] [address] o [phone] o Mon-Fri 8:00-5:00 o Babies seen by providers at [facility] o Does NOT accept Medicaid/Commercial Insurance Only . [facility] ([facility]  o Artis, MD; Barnes, MD; Bratton, MD; Coccaro, MD; Lockett Gardner, MD; Kramer, MD; Marshall, MD; Netherton, MD; Poleto, MD; Skinner, MD o [address] [place], [place] [address] o [phone] o Mon-Fri 8:30-5:30, Sat (Oct.-Mar.) 9:00-1:00 o Babies seen by providers at [facility] o Accepting Medicaid  [place] ([address]) . [facility] o Reid, MD; Warner, MD o [address], , [place] [address] o [phone] o Mon-Fri 8:30-5:00, Sat 8:30-12:00 o Providers come to see babies at [facility] o Does NOT accept Medicaid . [facility] o Becker, PA; Hagler, MD; Scifres, PA; Sun, MD; Swayne, MD o [address],  [place], [place] [address] o [phone] o Mon-Fri 8:00-5:00 o Babies seen by providers at [facility] o Does NOT accept Medicaid o Only accepting babies of parents who are patients o Please call early in hospitalization for appointment (limited availability) . [facility] o Clark, MD; Frye, MD; Kelleher, MD; Mack, NP; Miller, MD; O'Keller, MD; Patterson, NP; Pudlo, MD; Puzio, MD; Thomas, MD; Tucker, MD; Twiselton, MD o [address], [place], [place] [address] o [phone] o Mon-Fri 8:00-5:00, Sat 9:00-12:00 o Providers come to see babies at [facility] o Does NOT accept Medicaid  [place] ([address]) . [facility] o Limited providers accepting new patients: Brake, NP; Wharton, PA o [address], [place], [place] [address] o [phone] o Mon-Fri 8:00-5:00 o Babies seen by providers at [facility] o Does NOT accept Medicaid o Only accepting babies of parents who are patients o Please call early in hospitalization for appointment (limited availability) . [facility] o Gay, MD; Quinlan, MD o [address] [place], [place] [address] o [phone] (press [phone] to schedule appointment) o Mon-Fri 8:00-5:00 o Providers come to see babies at [facility] o Does NOT accept Medicaid . [facility] o Mazer, MD o [address] [place], [place] [address] o [phone] o Mon-Fri 8:30-5:00 (lunch 12:30-1:00), extended hours by appointment only Wed 5:00-6:30 o Babies seen by [facility] providers o Accepting Medicaid . [facility] o Banks, MD; Jordan, MD; Koberlein, MD o [address], [place], [place] [address] o [phone] o Mon-Fri 8:00-5:00 o Babies seen by [facility] providers o Does NOT accept Medicaid . [facility] o Parker, MD; Hunter, MD; Wallace, DO o [address] [place], [place]  [address] o [phone] o Mon-Fri 8:00-5:00 o Babies seen by [facility] providers o Does NOT accept Medicaid . [facility] o Brandon, PA; Brecken, PA; Christy, NP; Dees, MD; DeClaire, MD; DeWeese, MD; Hansen, NP; Mills, NP; Parrish, NP; Smoot, NP; Summer, MD; Vapne, MD o [address] [place], [place] [address] o [phone] o Mon-Fri 8:30-5:00, Sat 10:00-1:00 o Providers come to see babies at [facility] o Does NOT accept Medicaid o Free prenatal information session Tuesdays at 4:45pm . [facility] o Bouska, MD; Gordon, PA; Jeffery, PA; Weber, PA o [address] [place] [address] o [phone] o Mon-Fri 7:30-5:30 o Babies seen by [facility] providers . [facility] o [address], [place], [place]  [address] o [phone]   Fax - [phone]  [place] ([address]) . [facility] o Reese, MD o [address] [place], [place] [address] o [phone] o Mon-Thur 8:00-6:00 o Providers come to see babies at [facility] o Accepting Medicaid . [facility] o Anderson, NP; Badger, MD; Beal, PA; Spencer, PA o [address] [place], [place] [address] o [phone] o Mon-Thur 7:30-7:30, Fri 7:30-4:30 o Babies seen by [facility] providers o Accepting Medicaid . [facility] o Agbuya, MD; Klett, NP; Romgoolam, MD o [address], [place], [place] [address] o [phone] o Mon-Fri 8:30-5:00, Sat 8:30-12:00 o Providers come to see babies at [facility] o Accepting Medicaid o Must have "Meet & Greet" appointment at office prior to delivery . [facility] o McCord,   MD; Wallace, MD; Wood, MD o [address], [place], [place] [address] o [phone] o Mon-Wed 8:00-6:00, Thur-Fri 8:00-5:00, Sat 9:00-12:00 o Providers come to  see babies at [facility] o Does NOT accept Medicaid o Only accepting siblings of current patients . [facility]  o [address], [place], [place]  [address] o [phone]   Fax - [phone] . [facility] o [address], [place], [place]  [address] o [phone]   Fax - [phone]  [place] ([address]) . [facility] o Cirigliano, DO; Matthews, DO o [address] [place], [place] [address] o [phone] o Mon-Fri 7:00-5:00 o Babies seen by [facility] providers o Does NOT accept Medicaid . [facility] o Briscoe, MD; Howley, PA; Moreira, PA o [address], [place], [place] [address] o [phone] o Mon-Fri 8:00-5:00 o Babies seen by [facility] providers o Accepting Medicaid . [facility] o Boyd, MD; Church, PA; Jones, NP; Osborn, PA o [address], , [place] [address] o [phone] o Mon-Fri 8:00-5:00 o Babies seen by providers at [facility] o Accepting Medicaid  [place] ([address]) . [facility] [place] o [place]ling, DO o [address] [place], [place] [address] o [phone] o Mon-Fri 8:00-5:00 o Babies seen by [facility] providers o Does NOT accept Medicaid o Limited availability, please call early in hospitalization to schedule follow-up . [facility] o Calderon, PA; Cummings, MD; Dillard, MD; Martin, PA; Olson, MD; VanDeven, PA o [address], [place], [place] [address] o [phone] o Mon-Fri 8:30-5:00, Sat 9:00-12:00 o Babies seen by providers at [facility] o Accepting Medicaid o Please register online then schedule online or call office o [email or web address] . [facility] ([facility] o Hunter, NP; Kumar, MD; Martin Rogers, PA o [address], [place], [place] [address] o [phone] o Mon-Fri 8:00-5:00 o Babies seen by providers at [facility] o Accepting Medicaid . [facility] ([facility] o [place], MD; Kristi Fleenor, NP; West, MD o [address], [place], [place] [address] o [phone] o Mon-Fri 8:00-5:30, Sat&Sun by appointment (phones open at 8:30) o Babies seen by [facility] providers o Accepting Medicaid o Must be a first-time baby or sibling of current patient . [facility] [place]  o [address], [place], [place]  [address] o [phone]   Fax - [phone]  [place] ([address]) . [facility] o Brown, PA; Cowen, PA; Rice, MD; Helton, PA; Spry, MD o [address] [place], [place] [address] o [phone] o Mon-Thur 8:00-7:00, Fri 8:00-5:00, Sat 8:00-12:00, Sun 9:00-12:00 o Babies seen by [facility] providers o Accepting Medicaid . [facility] o Coe-Goins, MD; Marshall, MD; Pierre-Louis, MD o [address], [place], [place] [address] o [phone] o Mon-Thur 8:00-5:00 o Babies seen by providers at [facility] o Accepting Medicaid . [facility] o Bratton, MD; Coe-Goins, MD; Hayes, MD; Lewis, MD; List, MD; Lott, MD; Marshall, MD; Moran, MD; O'Jaren Kearn, MD; Pierre-Louis, MD; Pitonzo, MD; Scholer, MD; Spangle, MD o [address] [place],    [address] o [phone] o Mon-Fri 8:00-5:30, Sat (Oct.-Mar.) 9:00-1:00 o Babies seen by providers at [facility] o Accepting Medicaid o Must fill out new patient packet, available online at [email or web address] . [facility] ([facility] o Friddle, NP; Harris, NP; Kelly, NP; Logan, MD;  Melvin, PA; Poth, MD; Ramadoss, MD; Stanton, NP o [address], [place], [place] [address] o [phone] o Mon-Thur 8:00-5:30, Fri 8:00-5:00 o Babies seen by providers at [facility] o Accepting Medicaid  [place] ([address]) . [facility] o Dixon, PA; [place], MD; Pickard, MD; Tapia, PA o [address], [place], [place] [address] o [phone] o Mon-Fri 8:00-5:00 o Babies seen by providers at [facility] o Accepting Medicaid   [place] ([address]) . [facility] [place] o Masneri, DO; Meyers, MD; Nelson, PA o [address], [place], [place] [address] o [phone] o Mon-Fri 8:00-5:00 o Babies seen by providers at [facility] o Does NOT accept Medicaid o Limited appointment availability, please call early in hospitalization  . [facility] [place] o Kunedd, DO; McGowen, MD o [address], [place], [place] [address] o [phone] o Mon-Fri 8:00-5:00 o Babies seen by [facility] providers o Does NOT accept Medicaid . [facility] [place] o Cameron, MD; MacDonald, MD; Michaels, PA; Nayak, MD o [address] [place] [address], [place], [place] [address] o [phone] o Mon-Fri 8:00-5:00 o After hours clinic ([address] [place], [place] [address]) [phone] Mon-Fri 5:00-8:00, Sat 12:00-6:00, Sun 10:00-4:00 o Babies seen by [facility] providers o Accepting Medicaid . [facility] [place] o [address], [place], [place]  [address] o [phone]   Fax - [phone]  [place] ([address]) . [facility] [place] [facility] o Andy, MD o [address], [place], [place] [address] o [phone] o Mon-Fri 8:00-5:00 o Babies seen by [facility] providers o Does NOT accept Medicaid . [facility] [place] ([facility] [place]) o Eksir, MD o [address], [place], [place]  [address] o [phone] o Mon-Thur 8:00-7:00, Fri 8:00-5:00, Sat 8:00-12:00 o Babies seen by providers at [facility] o Accepting Medicaid - but does not have vaccinations in office (must be received elsewhere) o Limited availability, please call early in hospitalization  [place] ([address]) . [facility]  o Charlene Flemming, MD o [address], [place]  [address] o [phone]  Fax [phone]   

## 2020-11-18 LAB — CBC/D/PLT+RPR+RH+ABO+RUB AB...
Antibody Screen: NEGATIVE
Basophils Absolute: 0.1 10*3/uL (ref 0.0–0.2)
Basos: 1 %
EOS (ABSOLUTE): 0.2 10*3/uL (ref 0.0–0.4)
Eos: 2 %
HCV Ab: <0.1 s/co ratio (ref 0.0–0.9)
HIV Screen 4th Generation wRfx: NONREACTIVE
Hematocrit: 35.5 % (ref 34.0–46.6)
Hemoglobin: 12.1 g/dL (ref 11.1–15.9)
Hepatitis B Surface Ag: NEGATIVE
Immature Grans (Abs): 0.1 10*3/uL (ref 0.0–0.1)
Immature Granulocytes: 1 %
Lymphocytes Absolute: 1.7 10*3/uL (ref 0.7–3.1)
Lymphs: 15 %
MCH: 30.3 pg (ref 26.6–33.0)
MCHC: 34.1 g/dL (ref 31.5–35.7)
MCV: 89 fL (ref 79–97)
Monocytes Absolute: 0.8 10*3/uL (ref 0.1–0.9)
Monocytes: 7 %
Neutrophils Absolute: 8.2 10*3/uL — ABNORMAL HIGH (ref 1.4–7.0)
Neutrophils: 74 %
Platelets: 239 10*3/uL (ref 150–450)
RBC: 4 x10E6/uL (ref 3.77–5.28)
RDW: 14.3 % (ref 11.7–15.4)
RPR Ser Ql: NONREACTIVE
Rh Factor: POSITIVE
Rubella Antibodies, IGG: <0.9 index — ABNORMAL LOW (ref >0.99)
WBC: 11.1 10*3/uL — ABNORMAL HIGH (ref 3.4–10.8)

## 2020-11-18 LAB — HCV INTERPRETATION

## 2020-11-18 LAB — HEMOGLOBIN A1C
Est. average glucose Bld gHb Est-mCnc: 108 mg/dL
Hgb A1c MFr Bld: 5.4 % (ref 4.8–5.6)

## 2020-11-19 LAB — URINE CULTURE, OB REFLEX

## 2020-11-19 LAB — CULTURE, OB URINE

## 2020-11-21 NOTE — BH Specialist Note (Signed)
Pt did not arrive to video visit and did not answer the phone ; Left HIPPA-compliant message to call back Roselyn Reef from Center for [employer] at [facility] at [phone] [facility] office); pt does not have MyChart, so unable to leave MyChart message.

## 2020-11-23 LAB — CYTOLOGY - PAP
Chlamydia: NEGATIVE
Comment: NEGATIVE
Comment: NEGATIVE
Comment: NEGATIVE
Comment: NORMAL
HPV 16: NEGATIVE
HPV 18 / 45: NEGATIVE
High risk HPV: POSITIVE — AB
Neisseria Gonorrhea: NEGATIVE

## 2020-11-25 ENCOUNTER — Telehealth: Payer: Self-pay | Admitting: *Deleted

## 2020-11-25 NOTE — Telephone Encounter (Addendum)
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 We will call back next week to discuss further. Note added to pt's appt w/Dr. Elgie Congo on 5/12 of need for Colpo.

## 2020-11-28 ENCOUNTER — Telehealth: Payer: Self-pay | Admitting: Lactation Services

## 2020-11-28 NOTE — Telephone Encounter (Signed)
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 Colposcopy recommended. Patient did not answer. LM for patient to call office for results and recommendation. Reviewed importance of returning call and to call at her earliest convenience.

## 2020-11-28 NOTE — Telephone Encounter (Signed)
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

## 2020-11-29 NOTE — Telephone Encounter (Signed)
Attempted to contact pt with results. Voicemail left stating that we are calling with results, a letter will be sent, and that we will f/u with her at her provider appt scheduled on 12/15/20.  Letter sent.  Need for colpo placed in pt's appt note.    Frances Nickels  11/29/20

## 2020-12-01 ENCOUNTER — Ambulatory Visit: Payer: Self-pay | Admitting: Clinical

## 2020-12-01 DIAGNOSIS — Z91199 Patient's noncompliance with other medical treatment and regimen due to unspecified reason: Secondary | ICD-10-CM

## 2020-12-01 DIAGNOSIS — Z5329 Procedure and treatment not carried out because of patient's decision for other reasons: Secondary | ICD-10-CM

## 2020-12-05 ENCOUNTER — Telehealth: Payer: Self-pay | Admitting: General Practice

## 2020-12-05 NOTE — Telephone Encounter (Signed)
Patient called and left message on nurse voicemail line requesting a call back for results. Called patient, no answer- left message we are returning her phone call, please call us back.

## 2020-12-15 ENCOUNTER — Ambulatory Visit (INDEPENDENT_AMBULATORY_CARE_PROVIDER_SITE_OTHER): Payer: Medicaid Other | Admitting: Obstetrics and Gynecology

## 2020-12-15 ENCOUNTER — Telehealth: Payer: Self-pay | Admitting: Obstetrics and Gynecology

## 2020-12-15 ENCOUNTER — Other Ambulatory Visit: Payer: Self-pay

## 2020-12-15 VITALS — BP 112/70 | HR 88 | Wt 148.0 lb

## 2020-12-15 DIAGNOSIS — F419 Anxiety disorder, unspecified: Secondary | ICD-10-CM

## 2020-12-15 DIAGNOSIS — R87612 Low grade squamous intraepithelial lesion on cytologic smear of cervix (LGSIL): Secondary | ICD-10-CM

## 2020-12-15 DIAGNOSIS — Z3A20 20 weeks gestation of pregnancy: Secondary | ICD-10-CM

## 2020-12-15 DIAGNOSIS — O3410 Maternal care for benign tumor of corpus uteri, unspecified trimester: Secondary | ICD-10-CM

## 2020-12-15 DIAGNOSIS — Z3480 Encounter for supervision of other normal pregnancy, unspecified trimester: Secondary | ICD-10-CM

## 2020-12-15 DIAGNOSIS — D259 Leiomyoma of uterus, unspecified: Secondary | ICD-10-CM

## 2020-12-15 NOTE — Patient Instructions (Signed)

## 2020-12-15 NOTE — Telephone Encounter (Signed)
Pt refused the advised [facility] appt during check out.

## 2020-12-15 NOTE — Progress Notes (Signed)
   PRENATAL VISIT NOTE  Subjective:  Heather Brock is a 30 y.o. G3P0020 at [redacted]w[redacted]d being seen today for ongoing prenatal care.  She is currently monitored for the following issues for this low-risk pregnancy and has Uterine fibroid during pregnancy, antepartum; Supervision of other normal pregnancy, antepartum; [redacted] weeks gestation of pregnancy; and Nausea and vomiting during pregnancy on their problem list.  Patient reports no complaints.  Contractions: Not present. Vag. Bleeding: None.  Movement: Present. Denies leaking of fluid.   The following portions of the patient's history were reviewed and updated as appropriate: allergies, current medications, past family history, past medical history, past social history, past surgical history and problem list.   Objective:   Vitals:   12/15/20 1327  BP: 112/70  Pulse: 88  Weight: 148 lb (67.1 kg)    Fetal Status: Fetal Heart Rate (bpm): 145   Movement: Present     General:  Alert, oriented and cooperative. Patient is in no acute distress.  Skin: Skin is warm and dry. No rash noted.   Cardiovascular: Normal heart rate noted  Respiratory: Normal respiratory effort, no problems with respiration noted  Abdomen: Soft, gravid, appropriate for gestational age.  Pain/Pressure: Present     Pelvic: Cervical exam deferred        Extremities: Normal range of motion.  Edema: None  Mental Status: Normal mood and affect. Normal behavior. Normal judgment and thought content.   Assessment and Plan:  Pregnancy: G3P0020 at [redacted]w[redacted]d 1. Supervision of other normal pregnancy, antepartum -doing well overall. Needs anatomy scan scheduled.   2. [redacted] weeks gestation of pregnancy -just got medicaid, desires genetic testing  3. Uterine fibroid during pregnancy, antepartum   4. Anxiety -will reschedule appointment with Jamie   5. LSIL -colpo postpartum    Preterm labor symptoms and general obstetric precautions including but not limited to vaginal bleeding,  contractions, leaking of fluid and fetal movement were reviewed in detail with the patient. Please refer to After Visit Summary for other counseling recommendations.   Return in about 4 weeks (around 01/12/2021) for OB, any provider.  No future appointments.  Janet Berlin, MD

## 2020-12-15 NOTE — Addendum Note (Signed)
Addended byMariane Baumgarten on: 12/15/2020 01:49 PM   Modules accepted: Orders

## 2020-12-28 ENCOUNTER — Encounter: Payer: Self-pay | Admitting: *Deleted

## 2020-12-30 ENCOUNTER — Ambulatory Visit: Payer: Medicaid Other | Attending: Obstetrics and Gynecology

## 2020-12-30 ENCOUNTER — Other Ambulatory Visit: Payer: Self-pay

## 2020-12-30 DIAGNOSIS — Z3480 Encounter for supervision of other normal pregnancy, unspecified trimester: Secondary | ICD-10-CM | POA: Diagnosis present

## 2021-01-12 ENCOUNTER — Other Ambulatory Visit: Payer: Self-pay

## 2021-01-12 ENCOUNTER — Other Ambulatory Visit (HOSPITAL_COMMUNITY)
Admission: RE | Admit: 2021-01-12 | Discharge: 2021-01-12 | Disposition: A | Discharge status: Home / Self Care | Payer: Medicaid Other | Source: Ambulatory Visit | Attending: Family Medicine | Admitting: Family Medicine

## 2021-01-12 ENCOUNTER — Ambulatory Visit (INDEPENDENT_AMBULATORY_CARE_PROVIDER_SITE_OTHER): Payer: Medicaid Other | Admitting: Obstetrics and Gynecology

## 2021-01-12 VITALS — BP 125/79 | HR 107 | Wt 162.5 lb

## 2021-01-12 DIAGNOSIS — N898 Other specified noninflammatory disorders of vagina: Secondary | ICD-10-CM | POA: Diagnosis present

## 2021-01-12 DIAGNOSIS — Z3A24 24 weeks gestation of pregnancy: Secondary | ICD-10-CM

## 2021-01-12 DIAGNOSIS — F419 Anxiety disorder, unspecified: Secondary | ICD-10-CM

## 2021-01-12 DIAGNOSIS — Z3480 Encounter for supervision of other normal pregnancy, unspecified trimester: Secondary | ICD-10-CM
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
## 2021-01-12 NOTE — Addendum Note (Signed)
Addended byMariane Baumgarten on: 01/12/2021 02:48 PM   Modules accepted: Orders

## 2021-01-13 LAB — CERVICOVAGINAL ANCILLARY ONLY
Bacterial Vaginitis (gardnerella): NEGATIVE
Candida Glabrata: NEGATIVE
Candida Vaginitis: NEGATIVE
Chlamydia: NEGATIVE
Comment: NEGATIVE
Comment: NEGATIVE
Comment: NEGATIVE
Comment: NEGATIVE
Comment: NEGATIVE
Comment: NORMAL
Neisseria Gonorrhea: NEGATIVE
Trichomonas: NEGATIVE

## 2021-01-16 ENCOUNTER — Encounter: Payer: Self-pay | Admitting: *Deleted

## 2021-02-13 ENCOUNTER — Other Ambulatory Visit: Payer: Self-pay

## 2021-02-13 ENCOUNTER — Ambulatory Visit (INDEPENDENT_AMBULATORY_CARE_PROVIDER_SITE_OTHER): Payer: Medicaid Other | Admitting: Obstetrics and Gynecology

## 2021-02-13 ENCOUNTER — Other Ambulatory Visit: Payer: Medicaid Other

## 2021-02-13 VITALS — BP 111/75 | HR 88 | Wt 176.6 lb

## 2021-02-13 DIAGNOSIS — Z23 Encounter for immunization: Secondary | ICD-10-CM

## 2021-02-13 DIAGNOSIS — Z3A28 28 weeks gestation of pregnancy: Secondary | ICD-10-CM

## 2021-02-13 DIAGNOSIS — Z3480 Encounter for supervision of other normal pregnancy, unspecified trimester: Secondary | ICD-10-CM | POA: Diagnosis not present

## 2021-02-13 DIAGNOSIS — O3410 Maternal care for benign tumor of corpus uteri, unspecified trimester: Secondary | ICD-10-CM

## 2021-02-13 DIAGNOSIS — D259 Leiomyoma of uterus, unspecified: Secondary | ICD-10-CM

## 2021-02-13 MED ORDER — PRENATAL VITAMINS 28-0.8 MG PO TABS: 1.0000 | ORAL_TABLET | Freq: Every day | ORAL | 6 refills | Status: DC

## 2021-02-13 NOTE — Progress Notes (Signed)
   PRENATAL VISIT NOTE  Subjective:  Heather Brock is a 30 y.o. G3P0020 at [redacted]w[redacted]d being seen today for ongoing prenatal care.  She is currently monitored for the following issues for this low-risk pregnancy and has Uterine fibroid during pregnancy, antepartum; Supervision of other normal pregnancy, antepartum; [redacted] weeks gestation of pregnancy; Nausea and vomiting during pregnancy; and [redacted] weeks gestation of pregnancy on their problem list.  Patient doing well with no acute concerns today. She reports no complaints.  Contractions: Not present. Vag. Bleeding: Small.  Movement: Present. Denies leaking of fluid.   Pt seen, and notes she has been eating lots of raw fish and sushi.  Pt advised to only eat cooked foods for the remainder of pregnancy.  Hand out given.  The following portions of the patient's history were reviewed and updated as appropriate: allergies, current medications, past family history, past medical history, past social history, past surgical history and problem list. Problem list updated.  Objective:   Vitals:   02/13/21 0825  BP: 111/75  Pulse: 88  Weight: 176 lb 9.6 oz (80.1 kg)    Fetal Status: Fetal Heart Rate (bpm): 159 Fundal Height: 29 cm Movement: Present     General:  Alert, oriented and cooperative. Patient is in no acute distress.  Skin: Skin is warm and dry. No rash noted.   Cardiovascular: Normal heart rate noted  Respiratory: Normal respiratory effort, no problems with respiration noted  Abdomen: Soft, gravid, appropriate for gestational age.  Pain/Pressure: Absent     Pelvic: Cervical exam deferred        Extremities: Normal range of motion.  Edema: Trace  Mental Status:  Normal mood and affect. Normal behavior. Normal judgment and thought content.   Assessment and Plan:  Pregnancy: [id number] at [redacted]w[redacted]d  1. Uterine fibroid during pregnancy, antepartum   2. Supervision of other normal pregnancy, antepartum Growth scan ordered per MFM - [place] [facility] OB FOLLOW UP;  Future - Tdap vaccine greater than or equal to 7yo IM - Prenatal Vit-Fe Fumarate-FA (PRENATAL VITAMINS) 28-0.8 MG TABS; Take 1 tablet by mouth daily.  Dispense: 30 tablet; Refill: 6  3. [redacted] weeks gestation of pregnancy Third trimester testing today  Preterm labor symptoms and general obstetric precautions including but not limited to vaginal bleeding, contractions, leaking of fluid and fetal movement were reviewed in detail with the patient.  Please refer to After Visit Summary for other counseling recommendations.   Return in about 2 weeks (around 02/27/2021) for ROB, in person.   Lynnda Shields, MD Faculty Attending Center for [facility]

## 2021-02-14 ENCOUNTER — Ambulatory Visit: Payer: Medicaid Other

## 2021-02-14 LAB — CBC
Hematocrit: 34.3 % (ref 34.0–46.6)
Hemoglobin: 11.2 g/dL (ref 11.1–15.9)
MCH: 30.9 pg (ref 26.6–33.0)
MCHC: 32.7 g/dL (ref 31.5–35.7)
MCV: 95 fL (ref 79–97)
Platelets: 208 10*3/uL (ref 150–450)
RBC: 3.63 x10E6/uL — ABNORMAL LOW (ref 3.77–5.28)
RDW: 13.2 % (ref 11.7–15.4)
WBC: 13.5 10*3/uL — ABNORMAL HIGH (ref 3.4–10.8)

## 2021-02-14 LAB — HIV ANTIBODY (ROUTINE TESTING W REFLEX): HIV Screen 4th Generation wRfx: NONREACTIVE

## 2021-02-14 LAB — GLUCOSE TOLERANCE, 2 HOURS W/ 1HR
Glucose, 1 hour: 148 mg/dL (ref 65–179)
Glucose, 2 hour: 95 mg/dL (ref 65–152)
Glucose, Fasting: 88 mg/dL (ref 65–91)

## 2021-02-14 LAB — RPR: RPR Ser Ql: NONREACTIVE

## 2021-02-27 ENCOUNTER — Ambulatory Visit (INDEPENDENT_AMBULATORY_CARE_PROVIDER_SITE_OTHER): Payer: Medicaid Other | Admitting: Family Medicine

## 2021-02-27 ENCOUNTER — Other Ambulatory Visit (HOSPITAL_COMMUNITY)
Admission: RE | Admit: 2021-02-27 | Discharge: 2021-02-27 | Disposition: A | Discharge status: Home / Self Care | Payer: Medicaid Other | Source: Ambulatory Visit | Attending: Obstetrics and Gynecology | Admitting: Obstetrics and Gynecology

## 2021-02-27 ENCOUNTER — Other Ambulatory Visit: Payer: Self-pay

## 2021-02-27 VITALS — BP 119/82 | HR 105 | Wt 178.1 lb

## 2021-02-27 DIAGNOSIS — N898 Other specified noninflammatory disorders of vagina: Secondary | ICD-10-CM

## 2021-02-27 DIAGNOSIS — R87612 Low grade squamous intraepithelial lesion on cytologic smear of cervix (LGSIL): Secondary | ICD-10-CM | POA: Insufficient documentation

## 2021-02-27 DIAGNOSIS — Z3493 Encounter for supervision of normal pregnancy, unspecified, third trimester: Secondary | ICD-10-CM

## 2021-02-27 DIAGNOSIS — R87810 Cervical high risk human papillomavirus (HPV) DNA test positive: Secondary | ICD-10-CM

## 2021-02-27 DIAGNOSIS — Z113 Encounter for screening for infections with a predominantly sexual mode of transmission: Secondary | ICD-10-CM

## 2021-02-27 DIAGNOSIS — Z2839 Other underimmunization status: Secondary | ICD-10-CM | POA: Insufficient documentation

## 2021-02-27 NOTE — Progress Notes (Signed)
   PRENATAL VISIT NOTE  Subjective:  Heather Brock is a 30 y.o. G3P0020 at 46w4dbeing seen today for ongoing prenatal care.  She is currently monitored for the following issues for this low-risk pregnancy and has Uterine fibroid during pregnancy, antepartum; Supervision of low-risk pregnancy, third trimester; Nausea and vomiting during pregnancy; Rubella non-immune status, antepartum; LGSIL on Pap smear of cervix; and Cervical high risk human papillomavirus (HPV) DNA test positive on their problem list.  Patient reports having vaginal irritation and vaginal discharge today.  Contractions: Not present. Vag. Bleeding: None.  Movement: Present. Denies leaking of fluid.   Reports her symptoms started within the last couple of days. Vaginal area feels irritated, particularly during intercourse with her partner. She has not tried anything OTC for her symptoms. She would like to see if she has an infection causing this.  The following portions of the patient's history were reviewed and updated as appropriate: allergies, current medications, past family history, past medical history, past social history, past surgical history and problem list.   Objective:   Vitals:   02/27/21 1424  BP: 119/82  Pulse: (!) 105  Weight: 178 lb 1.6 oz (80.8 kg)    Fetal Status: Fetal Heart Rate (bpm): 125 Fundal Height: 30 cm Movement: Present     General:  Alert, oriented and cooperative. Patient is in no acute distress.  Skin: Skin is warm and dry. No rash noted.   Cardiovascular: Normal rate and regular rhythm.  Respiratory: Normal respiratory effort, no problems with respiration noted.  Abdomen: Soft, gravid, appropriate for gestational age.  Pain/Pressure: Absent     Pelvic: Cervical exam deferred.        Extremities: Normal range of motion.  Edema: None.  Mental Status: Normal mood and affect. Normal behavior. Normal judgment and thought content.   Assessment and Plan:  Pregnancy: G3P0020 at 382w4d1.  Vaginal discharge 2. Screening for STD (sexually transmitted disease) Patient reports two days of vaginal discharge associated with irritation. Sexually active with one partner. Wet prep and GC/CT obtained as below. Will follow up and treat as indicated. - Cervicovaginal ancillary only( [place])  3. Supervision of low-risk pregnancy, third trimester Progressing as expected. Continue routine prenatal care. Follow up in 2 weeks.  4. Rubella non-immune status, antepartum Plan for MMR postpartum.  5. LGSIL on Pap smear of cervix 6. Cervical high risk human papillomavirus (HPV) DNA test positive Pap smear on 11/17/20 at initial prenatal visit with LSIL and high risk HPV. Plan for repeat pap smear postpartum.  Preterm labor symptoms and general obstetric precautions including but not limited to vaginal bleeding, contractions, leaking of fluid and fetal movement were reviewed in detail with the patient. Please refer to After Visit Summary for other counseling recommendations.   Return in 2 weeks (on 03/13/2021) for follow up routine prenatal visit (LOB).  Future Appointments  Date Time Provider DeHildreth8/08/2020  3:30 PM [facility]8/08/2020  3:45 PM [facility]-MFC US4 [facility]FCUS [facility]03/13/2021  1:15 PM KoArdean LarsenKaMervyn SkeetersCNM [facility]  ChVilma MeckelMD Obstetrics Fellow

## 2021-02-27 NOTE — Patient Instructions (Signed)
[email or web address]">  Third Trimester of Pregnancy  The third trimester of pregnancy is from week 28 through week 40. This is months 7 through 9. The third trimester is a time when the unborn baby (fetus) is growing rapidly. At the end of the ninth month, the fetus is about 20inches long and weighs 6-10 pounds. Body changes during your third trimester During the third trimester, your body will continue to go through many changes.The changes vary and generally return to normal after your baby is born. Physical changes Your weight will continue to increase. You can expect to gain 25-35 pounds (11-16 kg) by the end of the pregnancy if you begin pregnancy at a normal weight. If you are underweight, you can expect to gain 28-40 lb (about 13-18 kg), and if you are overweight, you can expect to gain 15-25 lb (about 7-11 kg). You may begin to get stretch marks on your hips, abdomen, and breasts. Your breasts will continue to grow and may hurt. A yellow fluid (colostrum) may leak from your breasts. This is the first milk you are producing for your baby. You may have changes in your hair. These can include thickening of your hair, rapid growth, and changes in texture. Some people also have hair loss during or after pregnancy, or hair that feels dry or thin. Your belly button may stick out. You may notice more swelling in your hands, face, or ankles. Health changes You may have heartburn. You may have constipation. You may develop hemorrhoids. You may develop swollen, bulging veins (varicose veins) in your legs. You may have increased body aches in the pelvis, back, or thighs. This is due to weight gain and increased hormones that are relaxing your joints. You may have increased tingling or numbness in your hands, arms, and legs. The skin on your abdomen may also feel numb. You may feel short of breath because of your expanding uterus. Other  changes You may urinate more often because the fetus is moving lower into your pelvis and pressing on your bladder. You may have more problems sleeping. This may be caused by the size of your abdomen, an increased need to urinate, and an increase in your body's metabolism. You may notice the fetus "dropping," or moving lower in your abdomen (lightening). You may have increased vaginal discharge. You may notice that you have pain around your pelvic bone as your uterus distends. Follow these instructions at home: Medicines Follow your health care provider's instructions regarding medicine use. Specific medicines may be either safe or unsafe to take during pregnancy. Do not take any medicines unless approved by your health care provider. Take a prenatal vitamin that contains at least 600 micrograms (mcg) of folic acid. Eating and drinking Eat a healthy diet that includes fresh fruits and vegetables, whole grains, good sources of protein such as meat, eggs, or tofu, and low-fat dairy products. Avoid raw meat and unpasteurized juice, milk, and cheese. These carry germs that can harm you and your baby. Eat 4 or 5 small meals rather than 3 large meals a day. You may need to take these actions to prevent or treat constipation: Drink enough fluid to keep your urine pale yellow. Eat foods that are high in fiber, such as beans, whole grains, and fresh fruits and vegetables. Limit foods that are high in fat and processed sugars, such as fried or sweet foods. Activity Exercise only as directed by your health care provider. Most people can continue their usual exercise routine during pregnancy. Try  to exercise for 30 minutes at least 5 days a week. Stop exercising if you experience contractions in the uterus. Stop exercising if you develop pain or cramping in the lower abdomen or lower back. Avoid heavy lifting. Do not exercise if it is very hot or humid or if you are at a high altitude. If you choose to,  you may continue to have sex unless your health care provider tells you not to. Relieving pain and discomfort Take frequent breaks and rest with your legs raised (elevated) if you have leg cramps or low back pain. Take warm sitz baths to soothe any pain or discomfort caused by hemorrhoids. Use hemorrhoid cream if your health care provider approves. Wear a supportive bra to prevent discomfort from breast tenderness. If you develop varicose veins: Wear support hose as told by your health care provider. Elevate your feet for 15 minutes, 3-4 times a day. Limit salt in your diet. Safety Talk to your health care provider before traveling far distances. Do not use hot tubs, steam rooms, or saunas. Wear your seat belt at all times when driving or riding in a car. Talk with your health care provider if someone is verbally or physically abusive to you. Preparing for birth To prepare for the arrival of your baby: Take prenatal classes to understand, practice, and ask questions about labor and delivery. Visit the hospital and tour the maternity area. Purchase a rear-facing car seat and make sure you know how to install it in your car. Prepare the baby's room or sleeping area. Make sure to remove all pillows and stuffed animals from the baby's crib to prevent suffocation. General instructions Avoid cat litter boxes and soil used by cats. These carry germs that can cause birth defects in the baby. If you have a cat, ask someone to clean the litter box for you. Do not douche or use tampons. Do not use scented sanitary pads. Do not use any products that contain nicotine or tobacco, such as cigarettes, e-cigarettes, and chewing tobacco. If you need help quitting, ask your health care provider. Do not use any herbal remedies, illegal drugs, or medicines that were not prescribed to you. Chemicals in these products can harm your baby. Do not drink alcohol. You will have more frequent prenatal exams during the  third trimester. During a routine prenatal visit, your health care provider will do a physical exam, perform tests, and discuss your overall health. Keep all follow-up visits. This is important. Where to find more information American Pregnancy Association: americanpregnancy.[place] and Gynecologists: PoolDevices.com.pt Office on [employer] Health: KeywordPortfolios.com.br Contact a health care provider if you have: A fever. Mild pelvic cramps, pelvic pressure, or nagging pain in your abdominal area or lower back. Vomiting or diarrhea. Bad-smelling vaginal discharge or foul-smelling urine. Pain when you urinate. A headache that does not go away when you take medicine. Visual changes or see spots in front of your eyes. Get help right away if: Your water breaks. You have regular contractions less than 5 minutes apart. You have spotting or bleeding from your vagina. You have severe abdominal pain. You have difficulty breathing. You have chest pain. You have fainting spells. You have not felt your baby move for the time period told by your health care provider. You have new or increased pain, swelling, or redness in an arm or leg. Summary The third trimester of pregnancy is from week 28 through week 40 (months 7 through 9). You may have more problems  sleeping. This can be caused by the size of your abdomen, an increased need to urinate, and an increase in your body's metabolism. You will have more frequent prenatal exams during the third trimester. Keep all follow-up visits. This is important. This information is not intended to replace advice given to you by your health care provider. Make sure you discuss any questions you have with your healthcare provider. Document Revised: 12/30/2019 Document Reviewed: 11/05/2019 Elsevier Patient Education  2022 [employer].

## 2021-02-28 LAB — CERVICOVAGINAL ANCILLARY ONLY
Bacterial Vaginitis (gardnerella): NEGATIVE
Candida Glabrata: NEGATIVE
Candida Vaginitis: NEGATIVE
Chlamydia: NEGATIVE
Comment: NEGATIVE
Comment: NEGATIVE
Comment: NEGATIVE
Comment: NEGATIVE
Comment: NEGATIVE
Comment: NORMAL
Neisseria Gonorrhea: NEGATIVE
Trichomonas: NEGATIVE

## 2021-03-06 ENCOUNTER — Other Ambulatory Visit: Payer: Self-pay

## 2021-03-06 ENCOUNTER — Ambulatory Visit: Payer: Medicaid Other | Attending: Obstetrics and Gynecology

## 2021-03-06 ENCOUNTER — Ambulatory Visit: Payer: Medicaid Other | Admitting: *Deleted

## 2021-03-06 ENCOUNTER — Encounter: Payer: Self-pay | Admitting: *Deleted

## 2021-03-06 VITALS — BP 127/86 | HR 104

## 2021-03-06 DIAGNOSIS — Z3493 Encounter for supervision of normal pregnancy, unspecified, third trimester: Secondary | ICD-10-CM | POA: Diagnosis present

## 2021-03-06 DIAGNOSIS — O3413 Maternal care for benign tumor of corpus uteri, third trimester: Secondary | ICD-10-CM | POA: Insufficient documentation

## 2021-03-06 DIAGNOSIS — O3410 Maternal care for benign tumor of corpus uteri, unspecified trimester: Secondary | ICD-10-CM | POA: Insufficient documentation

## 2021-03-06 DIAGNOSIS — Z3A31 31 weeks gestation of pregnancy: Secondary | ICD-10-CM | POA: Insufficient documentation

## 2021-03-06 DIAGNOSIS — D259 Leiomyoma of uterus, unspecified: Secondary | ICD-10-CM | POA: Insufficient documentation

## 2021-03-06 DIAGNOSIS — Z3480 Encounter for supervision of other normal pregnancy, unspecified trimester: Secondary | ICD-10-CM

## 2021-03-13 ENCOUNTER — Other Ambulatory Visit: Payer: Self-pay

## 2021-03-13 ENCOUNTER — Ambulatory Visit (INDEPENDENT_AMBULATORY_CARE_PROVIDER_SITE_OTHER): Payer: Medicaid Other | Admitting: Student

## 2021-03-13 VITALS — BP 108/72 | HR 108 | Wt 189.9 lb

## 2021-03-13 DIAGNOSIS — Z3A32 32 weeks gestation of pregnancy: Secondary | ICD-10-CM

## 2021-03-13 DIAGNOSIS — F419 Anxiety disorder, unspecified: Secondary | ICD-10-CM

## 2021-03-13 DIAGNOSIS — Z3493 Encounter for supervision of normal pregnancy, unspecified, third trimester: Secondary | ICD-10-CM

## 2021-03-13 DIAGNOSIS — O09899 Supervision of other high risk pregnancies, unspecified trimester: Secondary | ICD-10-CM

## 2021-03-13 DIAGNOSIS — Z2839 Other underimmunization status: Secondary | ICD-10-CM

## 2021-03-13 NOTE — Progress Notes (Addendum)
Subjective:  Heather Brock is a 30 y.o. G3P0020 at 66w4dbeing seen today for prenatal care.  Patient reports fatigue.  Contractions: Not present.  Vag. Bleeding: None. Movement: Present. Denies leaking of fluid.   The patient is worried about this pregnancy and delivery. Her mother had 4 c-sections and on the 4th surgery her mother had major complications and "almost died." The patient was not able to elaborate on the specific complications her mother had and no longer speaks to her mom. The patient is worried this could happen to her.   The following portions of the patient's history were reviewed and updated as appropriate: allergies, current medications, past family history, past medical history, past social history, past surgical history and problem list.   Objective:   Vitals:   03/13/21 1321  BP: 108/72  Pulse: (!) 108  Weight: 189 lb 14.4 oz (86.1 kg)    Fetal Status: Fetal Heart Rate (bpm): 139 Fundal Height: 34 cm Movement: Present     General:  Alert, oriented and cooperative. Patient is in no acute distress.  Skin: Skin is warm and dry. No rash noted.   Cardiovascular: Normal heart rate noted  Respiratory: Normal respiratory effort, no problems with respiration noted  Abdomen: Soft, gravid, appropriate for gestational age. Pain/Pressure: Present     Vaginal: Vag. Bleeding: None.       Cervix: Not evaluated        Extremities: Normal range of motion.  Edema: Trace  Mental Status: Normal mood and affect. Normal behavior. Normal judgment and thought content.   Urinalysis:      Assessment and Plan:  Pregnancy: G3P0020 at 369w4d1. Supervision of low-risk pregnancy, third trimester - Denies contractions, LOF, abnormal discharge, or bleeding  - Normal fetal movement  - Discussed anticipated SVD with the patient and talked in detail about the process of labor - Assured patient her pregnancy was very healthy and we had no current reasons to be concerned for any complications   2.  [redacted] weeks gestation of pregnancy  3. Rubella non-immune status, antepartum - MMR to be given postpartum   4. Anxiety  - Patient experiences high levels of anxiety about this pregnancy and future labor  - Provided the patient with ideas to help with relaxation including yoga, meditation, walking, etc.  - Patient declined referral to behavioral health services  - She declined medication management for her anxiety   Preterm labor symptoms and general obstetric precautions including but not limited to vaginal bleeding, contractions, leaking of fluid and fetal movement were reviewed in detail with the patient. Please refer to After Visit Summary for other counseling recommendations.  No follow-ups on file.   GrCecilie LowersStNew Richland

## 2021-03-20 ENCOUNTER — Ambulatory Visit: Payer: Medicaid Other

## 2021-03-29 ENCOUNTER — Other Ambulatory Visit: Payer: Self-pay

## 2021-03-29 ENCOUNTER — Ambulatory Visit (INDEPENDENT_AMBULATORY_CARE_PROVIDER_SITE_OTHER): Payer: Medicaid Other | Admitting: Obstetrics and Gynecology

## 2021-03-29 ENCOUNTER — Other Ambulatory Visit (HOSPITAL_COMMUNITY)
Admission: RE | Admit: 2021-03-29 | Discharge: 2021-03-29 | Disposition: A | Discharge status: Home / Self Care | Payer: Medicaid Other | Source: Ambulatory Visit | Attending: Obstetrics and Gynecology | Admitting: Obstetrics and Gynecology

## 2021-03-29 VITALS — BP 128/79 | HR 108 | Wt 191.0 lb

## 2021-03-29 DIAGNOSIS — R87612 Low grade squamous intraepithelial lesion on cytologic smear of cervix (LGSIL): Secondary | ICD-10-CM

## 2021-03-29 DIAGNOSIS — N898 Other specified noninflammatory disorders of vagina: Secondary | ICD-10-CM

## 2021-03-29 DIAGNOSIS — O26893 Other specified pregnancy related conditions, third trimester: Secondary | ICD-10-CM | POA: Diagnosis not present

## 2021-03-29 DIAGNOSIS — Z3493 Encounter for supervision of normal pregnancy, unspecified, third trimester: Secondary | ICD-10-CM

## 2021-03-29 DIAGNOSIS — D259 Leiomyoma of uterus, unspecified: Secondary | ICD-10-CM

## 2021-03-29 DIAGNOSIS — R350 Frequency of micturition: Secondary | ICD-10-CM

## 2021-03-29 DIAGNOSIS — R87810 Cervical high risk human papillomavirus (HPV) DNA test positive: Secondary | ICD-10-CM

## 2021-03-29 DIAGNOSIS — O3410 Maternal care for benign tumor of corpus uteri, unspecified trimester: Secondary | ICD-10-CM

## 2021-03-29 LAB — POCT URINALYSIS DIP (DEVICE)
Bilirubin Urine: NEGATIVE
Glucose, UA: NEGATIVE mg/dL
Hgb urine dipstick: NEGATIVE
Ketones, ur: NEGATIVE mg/dL
Nitrite: NEGATIVE
Protein, ur: NEGATIVE mg/dL
Specific Gravity, Urine: >=1.03 (ref 1.005–1.030)
Urobilinogen, UA: 0.2 mg/dL (ref 0.0–1.0)
pH: 6 (ref 5.0–8.0)

## 2021-03-29 NOTE — Progress Notes (Signed)
    PRENATAL VISIT NOTE  Subjective:  Heather Brock is a 30 y.o. G3P0020 at 6w6dbeing seen today for ongoing prenatal care.  She is currently monitored for the following issues for this low-risk pregnancy and has Uterine fibroid during pregnancy, antepartum; Supervision of low-risk pregnancy, third trimester; Nausea and vomiting during pregnancy; Rubella non-immune status, antepartum; LGSIL on Pap smear of cervix; and Cervical high risk human papillomavirus (HPV) DNA test positive on their problem list.  Patient reports  increased urinary frequency and vaginal discharge (white)  .  Contractions: Irritability. Vag. Bleeding: None.  Movement: Present. Denies leaking of fluid.   The following portions of the patient's history were reviewed and updated as appropriate: allergies, current medications, past family history, past medical history, past social history, past surgical history and problem list.   Objective:   Vitals:   03/29/21 1619  BP: 128/79  Pulse: (!) 108  Weight: 191 lb (86.6 kg)    Fetal Status: Fetal Heart Rate (bpm): 139 Fundal Height: 36 cm Movement: Present  Presentation: Vertex  General:  Alert, oriented and cooperative. Patient is in no acute distress.  Skin: Skin is warm and dry. No rash noted.   Cardiovascular: Normal heart rate noted  Respiratory: Normal respiratory effort, no problems with respiration noted  Abdomen: Soft, gravid, appropriate for gestational age.  Pain/Pressure: Present     Pelvic: Done with chaperone. Cervix visually closed, white, normal d/c of pregnancy present, no VB. Dilation: 1 Effacement (%): 20 Station: -3 on cx check  Extremities: Normal range of motion.  Edema: Trace  Mental Status: Normal mood and affect. Normal behavior. Normal judgment and thought content.   Assessment and Plan:  Pregnancy: [id number] 357w6d. Supervision of low-risk pregnancy, third trimester GBS nv - Culture, OB Urine  2. Uterine fibroid during pregnancy,  antepartum 8/1: 63%, 1946gm, ac 73%, afi 15.  5cm IM anterior on 5/27. No comment on 8/1 u/s. Rpt u/s prn   3. Cervical high risk human papillomavirus (HPV) DNA test positive  4. LGSIL on Pap smear of cervix Colpo deferred to pp visit  5. Urinary frequency Udip neg except for wbc. Ucx sent  6. Vaginal discharge during pregnancy in third trimester - [place]inal [place] only( [place] Preterm labor symptoms and general obstetric precautions including but not limited to vaginal bleeding, contractions, leaking of fluid and fetal movement were reviewed in detail with the patient. Please refer to After Visit Summary for other counseling recommendations.   Return in about 10 days (around 04/08/2021) for 10-14d rob, in person, md or app, low risk ob.  No future appointments.  ChAletha HalimMD

## 2021-03-30 LAB — CERVICOVAGINAL ANCILLARY ONLY
Bacterial Vaginitis (gardnerella): POSITIVE — AB
Candida Glabrata: NEGATIVE
Candida Vaginitis: NEGATIVE
Chlamydia: NEGATIVE
Comment: NEGATIVE
Comment: NEGATIVE
Comment: NEGATIVE
Comment: NEGATIVE
Comment: NEGATIVE
Comment: NORMAL
Neisseria Gonorrhea: NEGATIVE
Trichomonas: NEGATIVE

## 2021-03-31 LAB — URINE CULTURE, OB REFLEX

## 2021-03-31 LAB — CULTURE, OB URINE

## 2021-04-03 MED ORDER — METRONIDAZOLE 500 MG PO TABS: 500.0000 mg | ORAL_TABLET | Freq: Two times a day (BID) | ORAL | 0 refills | Status: AC

## 2021-04-03 NOTE — Addendum Note (Signed)
Addended by: Aletha Halim on: 04/03/2021 10:28 AM   Modules accepted: Orders

## 2021-04-16 ENCOUNTER — Inpatient Hospital Stay (HOSPITAL_COMMUNITY)
Admission: AD | Admit: 2021-04-16 | Discharge: 2021-04-20 | DRG: 806 | Disposition: A | Discharge status: Home / Self Care | Payer: Medicaid Other | Attending: Family Medicine | Admitting: Family Medicine

## 2021-04-16 ENCOUNTER — Other Ambulatory Visit: Payer: Self-pay

## 2021-04-16 ENCOUNTER — Encounter (HOSPITAL_COMMUNITY): Payer: Self-pay | Admitting: Family Medicine

## 2021-04-16 DIAGNOSIS — O3410 Maternal care for benign tumor of corpus uteri, unspecified trimester: Secondary | ICD-10-CM | POA: Diagnosis present

## 2021-04-16 DIAGNOSIS — Z23 Encounter for immunization: Secondary | ICD-10-CM

## 2021-04-16 DIAGNOSIS — O4212 Full-term premature rupture of membranes, onset of labor more than 24 hours following rupture: Secondary | ICD-10-CM | POA: Diagnosis not present

## 2021-04-16 DIAGNOSIS — Z3A37 37 weeks gestation of pregnancy: Secondary | ICD-10-CM

## 2021-04-16 DIAGNOSIS — O26893 Other specified pregnancy related conditions, third trimester: Secondary | ICD-10-CM | POA: Diagnosis present

## 2021-04-16 DIAGNOSIS — Z20822 Contact with and (suspected) exposure to covid-19: Secondary | ICD-10-CM | POA: Diagnosis present

## 2021-04-16 DIAGNOSIS — D259 Leiomyoma of uterus, unspecified: Secondary | ICD-10-CM | POA: Diagnosis present

## 2021-04-16 DIAGNOSIS — Z2839 Other underimmunization status: Secondary | ICD-10-CM

## 2021-04-16 DIAGNOSIS — O9081 Anemia of the puerperium: Secondary | ICD-10-CM | POA: Diagnosis not present

## 2021-04-16 DIAGNOSIS — Z87891 Personal history of nicotine dependence: Secondary | ICD-10-CM | POA: Diagnosis not present

## 2021-04-16 DIAGNOSIS — R87612 Low grade squamous intraepithelial lesion on cytologic smear of cervix (LGSIL): Secondary | ICD-10-CM | POA: Diagnosis present

## 2021-04-16 DIAGNOSIS — D62 Acute posthemorrhagic anemia: Secondary | ICD-10-CM | POA: Diagnosis not present

## 2021-04-16 DIAGNOSIS — O09899 Supervision of other high risk pregnancies, unspecified trimester: Secondary | ICD-10-CM

## 2021-04-16 DIAGNOSIS — O3413 Maternal care for benign tumor of corpus uteri, third trimester: Principal | ICD-10-CM | POA: Diagnosis present

## 2021-04-16 LAB — RESP PANEL BY RT-PCR (FLU A&B, COVID) ARPGX2
Influenza A by PCR: NEGATIVE
Influenza B by PCR: NEGATIVE
SARS Coronavirus 2 by RT PCR: NEGATIVE

## 2021-04-16 LAB — CBC
HCT: 32.4 % — ABNORMAL LOW (ref 36.0–46.0)
Hemoglobin: 10.4 g/dL — ABNORMAL LOW (ref 12.0–15.0)
MCH: 27.8 pg (ref 26.0–34.0)
MCHC: 32.1 g/dL (ref 30.0–36.0)
MCV: 86.6 fL (ref 80.0–100.0)
Platelets: 239 10*3/uL (ref 150–400)
RBC: 3.74 MIL/uL — ABNORMAL LOW (ref 3.87–5.11)
RDW: 14.7 % (ref 11.5–15.5)
WBC: 16.9 10*3/uL — ABNORMAL HIGH (ref 4.0–10.5)
nRBC: 0 % (ref 0.0–0.2)

## 2021-04-16 LAB — TYPE AND SCREEN
ABO/RH(D): O POS
Antibody Screen: NEGATIVE

## 2021-04-16 LAB — GROUP B STREP BY PCR: Group B strep by PCR: NEGATIVE

## 2021-04-16 LAB — POCT FERN TEST: POCT Fern Test: POSITIVE

## 2021-04-16 MED ORDER — ONDANSETRON HCL 4 MG/2ML IJ SOLN: 4.0000 mg | Freq: Four times a day (QID) | INTRAMUSCULAR | Status: DC | PRN

## 2021-04-16 MED ORDER — OXYTOCIN-SODIUM CHLORIDE 30-0.9 UT/500ML-% IV SOLN
2.5000 [IU]/h | INTRAVENOUS | Status: DC
Start: 2021-04-16 — End: 2021-04-18
  Administered 2021-04-18: 2.5 [IU]/h via INTRAVENOUS

## 2021-04-16 MED ORDER — OXYTOCIN BOLUS FROM INFUSION
333.0000 mL | Freq: Once | INTRAVENOUS | Status: AC
Start: 2021-04-16 — End: 2021-04-18
  Administered 2021-04-18: 333 mL via INTRAVENOUS

## 2021-04-16 MED ORDER — ACETAMINOPHEN 325 MG PO TABS
650.0000 mg | ORAL_TABLET | ORAL | Status: DC | PRN
Start: 2021-04-16 — End: 2021-04-18

## 2021-04-16 MED ORDER — OXYCODONE-ACETAMINOPHEN 5-325 MG PO TABS: 2.0000 | ORAL_TABLET | ORAL | Status: DC | PRN

## 2021-04-16 MED ORDER — OXYCODONE-ACETAMINOPHEN 5-325 MG PO TABS: 1.0000 | ORAL_TABLET | ORAL | Status: DC | PRN

## 2021-04-16 MED ORDER — SOD CITRATE-CITRIC ACID 500-334 MG/5ML PO SOLN: 30.0000 mL | ORAL | Status: DC | PRN

## 2021-04-16 MED ORDER — LIDOCAINE HCL (PF) 1 % IJ SOLN
30.0000 mL | INTRAMUSCULAR | Status: AC | PRN
Start: 2021-04-16 — End: 2021-04-18
  Administered 2021-04-18: 30 mL via SUBCUTANEOUS
  Filled 2021-04-16: qty 30

## 2021-04-16 MED ORDER — LACTATED RINGERS IV SOLN: INTRAVENOUS | Status: DC

## 2021-04-16 MED ORDER — LACTATED RINGERS IV SOLN
500.0000 mL | INTRAVENOUS | Status: DC | PRN
  Administered 2021-04-17: 500 mL via INTRAVENOUS
  Administered 2021-04-17: 250 mL via INTRAVENOUS

## 2021-04-16 NOTE — H&P (Signed)
Heather Brock is a 30 y.o. female G3P0020 with IUP at 78w3dby LMP presenting for possible ruptured membranes. Patient reports at 7am she noticed continuous leaking of clear fluid. She reports some mild contractions. Denies vaginal bleeding. Endorses active fetal movement. She has no blurry vision, headaches or peripheral edema, and RUQ pain. She plans on breastfeeding. She declines contraception. She received her prenatal care at  [facility] --->  Estimated Date of Delivery: 05/04/21  Sono:    '@[redacted]w[redacted]d'$ , CWD, normal anatomy, breech presentation, posterior placenta, 547g, 82% EFW   Prenatal History/Complications:  - Uterine fibroid - LGSIL - Rubella non-immune - Anxiety  Past Medical History: Past Medical History:  Diagnosis Date   Chlamydia    Depression    when younger, ok now   Fibroid    Gonorrhea    Medical history non-contributory    Trichomonas exposure     Past Surgical History: Past Surgical History:  Procedure Laterality Date   NO PAST SURGERIES     THERAPEUTIC ABORTION      Obstetrical History: OB History     Gravida  3   Para      Term      Preterm      AB  2   Living  0      SAB      IAB  2   Ectopic      Multiple      Live Births  0           Social History Social History   Socioeconomic History   Marital status: Single    Spouse name: Not on file   Number of children: Not on file   Years of education: Not on file   Highest education level: Not on file  Occupational History   Not on file  Tobacco Use   Smoking status: Former    Types: Cigarettes   Smokeless tobacco: Never   Tobacco comments:    2 packs /day- quit 2015  Vaping Use   Vaping Use: Never used  Substance and Sexual Activity   Alcohol use: Never   Drug use: Not Currently    Types: Marijuana    Comment: stopped in Jan 2022   Sexual activity: Yes    Birth control/protection: None  Other Topics Concern   Not  on file  Social History Narrative   Not on file   Social Determinants of Health   Financial Resource Strain: Not on file  Food Insecurity: No Food Insecurity   Worried About Running Out of Food in the Last Year: Never true   [place] the [place]: Never true  Transportation Needs: No Transportation Needs   Lack of Transportation (Medical): No   Lack of Transportation (Non-Medical): No  Physical Activity: Not on file  Stress: Not on file  Social Connections: Not on file    Family History: Family History  Problem Relation Age of Onset   Hyperlipidemia Father    Hypertension Father     Allergies: Allergies  Allergen Reactions   Latex     Irritation with condoms    Medications Prior to Admission  Medication Sig Dispense Refill Last Dose   Prenatal Vit-Fe Fumarate-FA (PRENATAL VITAMINS) 28-0.8 MG TABS Take 1 tablet by mouth daily. 30 tablet 6    Review of Systems   All systems reviewed and negative except as stated in HPI  Blood  pressure 138/74, pulse (!) 130, temperature 98.1 F (36.7 C), temperature source Oral, resp. rate 18, last menstrual period 07/28/2020, SpO2 97 %. General appearance: alert, cooperative, and anxious Lungs: effort normal Heart: regular rate Abdomen: soft, non-tender; gravid Pelvic: copious amount of blood tinged amniotic fluid pooling; fern positive Extremities: Homans sign is negative, no sign of DVT Presentation: cephalic Fetal monitoring: 135 bpm, moderate variability, 15x15 accels present, decels absent Uterine activity: Q 44mns Dilation: 4 Effacement (%): 60 Station: -2 Exam by:: jFredda HammedRN  Prenatal labs: ABO, Rh: O/Positive/-- (04/14 1457) Antibody: Negative (04/14 1457) Rubella: <0.90 (04/14 1457) RPR: Non Reactive (07/11 0819)  HBsAg: Negative (04/14 1457)  HIV: Non Reactive (07/11 0819)  GBS: unknown  2 hr Glucola: 88/148/95 Genetic screening: low risk Anatomy [place] normal  Prenatal Transfer Tool   Maternal Diabetes: No Genetic Screening: Normal Maternal Ultrasounds/Referrals: Normal Fetal Ultrasounds or other Referrals:  None Maternal Substance Abuse:  No Significant Maternal Medications:  None Significant Maternal Lab Results: GBS unknown  Results for orders placed or performed during the hospital encounter of 04/16/21 (from the past 24 hour(s))  Fern Test   Collection Time: 04/16/21  4:32 PM  Result Value Ref Range   POCT Fern Test Positive = ruptured amniotic membanes     Patient Active Problem List   Diagnosis Date Noted   Rubella non-immune status, antepartum 02/27/2021   LGSIL on Pap smear of cervix 02/27/2021   Supervision of low-risk pregnancy, third trimester 11/17/2020   Nausea and vomiting during pregnancy 11/17/2020   Uterine fibroid during pregnancy, antepartum 09/21/2020    Assessment/Plan:  MBibian Musichis a [age]y.o. G3P0020 at 372w3dere for SROM  #Labor: Expectant management as patient is very hesitant about Pitocin. I discussed the R/B of Pitocin with patient. Will reassess accordingly.  #Pain: declines #FWB: Category 1 #ID: GBS unknown, PCR pending. Given term, will not initiate prophylactic antibiotics #MOF: Breast #MOC: Declines #Circ: No   DaRenee HarderCNM  04/16/2021, 4:35 PM

## 2021-04-16 NOTE — MAU Note (Signed)
Pt reports to mau with c/o SROM around 0700 this morning.  Pt reports ctx but is unsure of pattern.

## 2021-04-16 NOTE — Progress Notes (Signed)
Labor Progress Note Heather Brock is a 30 y.o. G3P0020 at 87w3dpresented for SROM at 0700.  S: Not feeling contractions/pressure at this time. Would like to avoid pitocin if at all possible.   O:  BP 138/74 (BP Location: Right Arm)   Pulse (!) 130   Temp 98.1 F (36.7 C) (Oral)   Resp 18   LMP 07/28/2020   SpO2 97%  EFM: 130/moderate/accels present, no decels  CVE: Dilation: 5 Effacement (%): 70 Cervical Position: Middle Station: -1 Presentation: Vertex Exam by:: jJanett Billowhashem RN   A&P: 30y.o. G3P0020 356w3d#Labor: Progressing well. Making change without augmentation. Continue expectant management.  #Pain: PRN, does not desire epidural #FWB: Cat I  #GBS negative #Anxiety: Does not want benzos, may be open to vistaril. Will trial walking around, breathing exercises etc.   BaPearla DubonnetMD 10:20 PM

## 2021-04-17 ENCOUNTER — Encounter: Payer: Medicaid Other | Admitting: Nurse Practitioner

## 2021-04-17 LAB — RPR: RPR Ser Ql: NONREACTIVE

## 2021-04-17 MED ORDER — TERBUTALINE SULFATE 1 MG/ML IJ SOLN: 0.2500 mg | Freq: Once | INTRAMUSCULAR | Status: DC | PRN

## 2021-04-17 MED ORDER — PHENYLEPHRINE 40 MCG/ML (10ML) SYRINGE FOR IV PUSH (FOR BLOOD PRESSURE SUPPORT): 80.0000 ug | PREFILLED_SYRINGE | INTRAVENOUS | Status: DC | PRN

## 2021-04-17 MED ORDER — FENTANYL-BUPIVACAINE-NACL 0.5-0.125-0.9 MG/250ML-% EP SOLN
12.0000 mL/h | EPIDURAL | Status: DC | PRN
  Filled 2021-04-17: qty 250

## 2021-04-17 MED ORDER — LACTATED RINGERS IV SOLN: 500.0000 mL | Freq: Once | INTRAVENOUS | Status: DC

## 2021-04-17 MED ORDER — OXYTOCIN-SODIUM CHLORIDE 30-0.9 UT/500ML-% IV SOLN
1.0000 m[IU]/min | INTRAVENOUS | Status: DC
  Administered 2021-04-17: 2 m[IU]/min via INTRAVENOUS
  Filled 2021-04-17: qty 500

## 2021-04-17 MED ORDER — DIPHENHYDRAMINE HCL 50 MG/ML IJ SOLN: 12.5000 mg | INTRAMUSCULAR | Status: DC | PRN

## 2021-04-17 MED ORDER — EPHEDRINE 5 MG/ML INJ: 10.0000 mg | INTRAVENOUS | Status: DC | PRN

## 2021-04-17 NOTE — Progress Notes (Signed)
Patient repeatedly removing and refusing blood pressures. CNM and MD aware.   Juluis Mire, RN

## 2021-04-17 NOTE — Progress Notes (Signed)
Heather Brock is a 30 y.o. G3P0020 at [redacted]w[redacted]d  Subjective: Patient extremely uncomfortable during the contractions but extremely tired between contractions.  Objective: BP (!) 112/57   Pulse (!) 130   Temp 99.3 F (37.4 C) (Axillary)   Resp 16   LMP 07/28/2020   SpO2 100%  No intake/output data recorded. No intake/output data recorded.  FHT:  130, mod var, + accels, occasional variable decelerations  UC:   q 1-3 SVE:   Dilation: Lip/rim Effacement (%): 90 Station: Plus 1 Exam by:: WJeronimo Greaves CNM  Labs: Lab Results  Component Value Date   WBC 16.9 (H) 04/16/2021   HGB 10.4 (L) 04/16/2021   HCT 32.4 (L) 04/16/2021   MCV 86.6 04/16/2021   PLT 239 04/16/2021    Assessment / Plan: -- Patient is completely dilated and we pushed for 1-1/2 hours.  Moderate amount of caput noted at +2 position.  Due to maternal exhaustion we are going to let patient labor down and began pushing again in 1 hour. --Afebrile --Cat I tracing --GBS Neg -- Patient is currently on Pitocin -Patient currently using nitrous for pain control.  Does not want epidural at this time.  Discussed that this was an option. --Anticipate vaginal delivery   VGifford Shave CNM 04/17/2021, 1100

## 2021-04-17 NOTE — Progress Notes (Signed)
Rounding attempt #2. Cat I tracing. Patient sleeping. Did not wake with me calling her name. Will attempt to round again later this morning.  Mallie Snooks, MSN, CNM Certified Nurse Midwife, Product/process development scientist for [employer], [place]

## 2021-04-17 NOTE — Progress Notes (Signed)
Labor status and tracing reviewed with Lexi, Therapist, sports. Patient verbalizing preference for time with family. Will defer rounding  Mallie Snooks, MSN, CNM Certified Nurse Midwife, Product/process development scientist for [employer], [place]

## 2021-04-17 NOTE — Progress Notes (Signed)
Labor Progress Note Makeesha Reffner is a 30 y.o. G3P0020 at 31w4dpresented for SROM at 0700 on 9/11 S: Feeling contractions somewhat, but not strong or painful yet. Still motivated to try and get herself into labor through position changes/walking the halls.  O:  BP 134/76 (BP Location: Left Arm)   Pulse 98   Temp 98.4 F (36.9 C) (Oral)   Resp 18   LMP 07/28/2020   SpO2 100%  EFM: 130/moderate/accels present, no decels  CVE: Dilation: 5 Effacement (%): 70 Cervical Position: Middle Station: -1 Presentation: Vertex Exam by:: SJoelyn Oms MD   A&P: 30y.o. GMP:[id number]#Labor: Progressing well. Nurse occupied in other patient room. Once free will check cervix and re-evaluate.  #Pain: PRN, not planning epidural #FWB: Cat I  #GBS negative  BaPearla DubonnetMD 3:24 AM

## 2021-04-17 NOTE — Progress Notes (Signed)
Heather Brock is a 30 y.o. G3P0020 at [redacted]w[redacted]d  Subjective: Patient rocking on birthing ball. Not making contact with labor team. Verbalizes to CNM and Lexi that she is "fed up" and "very frustrated".  Patient turning her back and walking away from CNM when CNM attempts to discuss birth plan, labor status, recommended interventions. Female support person speaking on behalf of patient, facilitating patient verbalization that she is considering Nitrous Oxide for analgesia.   Objective: BP 111/61   Pulse (!) 111   Temp 98 F (36.7 C) (Oral)   Resp 16   LMP 07/28/2020   SpO2 100%  No intake/output data recorded. No intake/output data recorded.  FHT:  120, mod var, + accels, no decels UC:   q 1-3 SVE:   Dilation: 5.5 Effacement (%): 70 Station: 0 Exam by:: LSheryle Hail RN  Labs: Lab Results  Component Value Date   WBC 16.9 (H) 04/16/2021   HGB 10.4 (L) 04/16/2021   HCT 32.4 (L) 04/16/2021   MCV 86.6 04/16/2021   PLT 239 04/16/2021    Assessment / Plan: --S/p SROM 07/11 at 0700 (28 hours ago) --Clear fluid --Afebrile --Cat I tracing --GBS Neg --Patient declined intervention overnight, agreeable to Pitocin on day shift --Pitocin 2 x 2 initiated at 0745, currently infusing at 10 milliunits --Patient encouraged to ambulate in room, squats and rocking at bedside, out of bed as much as tolerable --Will recheck cervix in 4-6 hours unless otherwise indicated by change in status --Anticipate vaginal delivery   SDarlina Rumpf CNM 04/17/2021, 1100

## 2021-04-17 NOTE — Progress Notes (Signed)
Heather Brock is a 30 y.o. G3P0020 at [redacted]w[redacted]d  Subjective: Patient screaming and involuntarily pushing with contractions. Very hesitant to reposition, encouragement coming from two support people present at bedside. Intermittently yelling at staff.  Objective: BP 108/65   Pulse (!) 118   Temp 99.1 F (37.3 C) (Axillary)   Resp 16   LMP 07/28/2020   SpO2 100%  No intake/output data recorded. No intake/output data recorded.  FHT:  FHR: 125 bpm, variability: moderate,  accelerations:  Present,  decelerations:  Present variable UC:   regular, every 2 minutes SVE:   Dilation: Lip/rim Effacement (%): 90 Station: Plus 1 Exam by:: WJeronimo Greaves CNM  Labs: Lab Results  Component Value Date   WBC 16.9 (H) 04/16/2021   HGB 10.4 (L) 04/16/2021   HCT 32.4 (L) 04/16/2021   MCV 86.6 04/16/2021   PLT 239 04/16/2021    Assessment / Plan: --CNM at bedside for additional patient report of constant perineal pressure, concern for imminent delivery --Patient with lip at maternal right and barely perceptible posterior lip --Patient in throne for prolonged period of time. Screaming with contractions. Initially declined CNM's recommendation for repositioning to far right lateral. Encouragement received from support team after which patient was amenable to repositioning. Assisted to far right lateral with peanut ball --Once in right lateral, screaming improved, patient noted to be overtly pushing with contractions. Explained to patient she is not 10/100 yet and we strongly advise laboring down vs pushing.  --Patient repeatedly states she needs to push, verbalizes understanding of recommendation to labor down until complete. Continues to push. No fetal movement during pushing --Discussed with patient that ineffective pushing at current stage is exhausting. Restated desire to continue nitrous oxide, labor down, reposition between far lateral positions as tolerated.  --Cervical lip reducible if immediate delivery  indicated --As of 1525 patient amenable to 30 min of laboring down per Dr. CCaron Presume--Anticipate vaginal delivery  SDarlina Rumpf [place]9/07/2021, 3:26 PM

## 2021-04-17 NOTE — Progress Notes (Signed)
Labor Progress Note Heather Brock is a 30 y.o. G3P0020 at 43w4dpresented for SROM.   Been pushing with patient since 2050, approximately 3 hours. She fortunately has progressed, with station at rest around 2+ and caput 3+/out of introitus with pushing.     O:  BP (!) 135/50   Pulse (!) 120   Temp 98 F (36.7 C) (Oral)   Resp 20   LMP 07/28/2020   SpO2 100%  EFM: 130/Mod/shallow occasional variables with pushing, +scalp stim   CVE: Dilation: 10 Dilation Complete Date: 04/17/21 Dilation Complete Time: 1615 Effacement (%): 90 Cervical Position: Middle Station: Plus 2 Presentation: Vertex Exam by:: Cresenzo, MD   A&P: 30y.o. G3P0020 357w4d#Labor: Progressing well. Patient is feeling more tired, but still with effective pushing, anticipate vaginal delivery. Dr. EuElonda Huskyware of pushing for three hours without epidural, will continue as is especially with reassuring fetal/maternal status. Discussed VAVD if needed for maternal exhaustion, however doing well for now.  #Pain: Nitrous  #FWB: Cat II, but reassuring and + scalp stim.  #GBS negative  SaPatriciaann ClanDO 11:47 PM

## 2021-04-17 NOTE — Progress Notes (Signed)
Labor Progress Note Heather Brock is a 30 y.o. G3P0020 at 1w4dpresented for SROM at 0700 on 9/11 S: Not feeling contractions at this time. States that she was having contractions with nipple stimulation.  We had a long discussion about risks/benefits of starting pitocin. Her primary goal is to avoid a C-section. We discussed the increased risk of infection the longer we go on with expectant management. However, she still prefers to defer starting pitocin at this time.   O:  BP 124/78   Pulse (!) 101   Temp 97.7 F (36.5 C) (Oral)   Resp 16   LMP 07/28/2020   SpO2 100%  EFM: 130/moderate/accels present, no decels  CVE: Dilation: 5 Effacement (%): 70 Cervical Position: Middle Station: -1 Presentation: Vertex Exam by:: SJoelyn Oms MD   A&P: 30y.o. GBC:[id number]#Labor: Not making change with expectant management. Approaching 24 hours ruptured. Patient refuses pitocin at this time. Understands infection risks with ongoing expectant management. Would like to trial aggressive nipple stimulation. #Pain: PRN, does not desire epidural #FWB: Cat I  #GBS negative  BaPearla DubonnetMD 6:24 AM

## 2021-04-17 NOTE — Progress Notes (Signed)
Heather Brock is a 30 y.o. G3P0020 at [redacted]w[redacted]d  Subjective: Patient sitting rocking on ball when I enter the room.  Is using the nitrous for pain relief which is helping.  Very pleasant throughout the entire encounter.  Objective: BP 125/68   Pulse (!) 123   Temp 99.3 F (37.4 C) (Axillary)   Resp 16   LMP 07/28/2020   SpO2 100%  No intake/output data recorded. No intake/output data recorded.  FHT:  125, mod var, + accels, no decels UC:   q 1-3 SVE:   Dilation: 7.5 Effacement (%): 90 Station: 0, Plus 1 Exam by:: VGifford Shave MD  Labs: Lab Results  Component Value Date   WBC 16.9 (H) 04/16/2021   HGB 10.4 (L) 04/16/2021   HCT 32.4 (L) 04/16/2021   MCV 86.6 04/16/2021   PLT 239 04/16/2021    Assessment / Plan: --S/p SROM 07/11 at 0700, fore bag filled at this evaluation,  FSE placed due to difficult tracing fetus, slow leak around FSE-correct abdomen --Clear fluid --Afebrile --Cat I tracing --GBS Neg --Patient declined intervention overnight, agreeable to Pitocin on day shift --Pitocin 2 x 2 initiated at 0745,  --Patient encouraged to ambulate in room, squats and rocking at bedside, out of bed as much as tolerable --Will recheck cervix in 4-6 hours unless otherwise indicated by change in status --Anticipate vaginal delivery   VGifford Shave CNM 04/17/2021, 1100

## 2021-04-18 ENCOUNTER — Encounter (HOSPITAL_COMMUNITY): Payer: Self-pay | Admitting: Family Medicine

## 2021-04-18 DIAGNOSIS — Z3A37 37 weeks gestation of pregnancy: Secondary | ICD-10-CM

## 2021-04-18 DIAGNOSIS — O4212 Full-term premature rupture of membranes, onset of labor more than 24 hours following rupture: Secondary | ICD-10-CM

## 2021-04-18 LAB — CBC
HCT: 25.4 % — ABNORMAL LOW (ref 36.0–46.0)
Hemoglobin: 8.3 g/dL — ABNORMAL LOW (ref 12.0–15.0)
MCH: 27.9 pg (ref 26.0–34.0)
MCHC: 32.7 g/dL (ref 30.0–36.0)
MCV: 85.5 fL (ref 80.0–100.0)
Platelets: 228 10*3/uL (ref 150–400)
RBC: 2.97 MIL/uL — ABNORMAL LOW (ref 3.87–5.11)
RDW: 14.9 % (ref 11.5–15.5)
WBC: 22.9 10*3/uL — ABNORMAL HIGH (ref 4.0–10.5)
nRBC: 0 % (ref 0.0–0.2)

## 2021-04-18 MED ORDER — ACETAMINOPHEN 325 MG PO TABS
650.0000 mg | ORAL_TABLET | ORAL | Status: DC | PRN
  Administered 2021-04-18: 650 mg via ORAL
  Filled 2021-04-18: qty 2

## 2021-04-18 MED ORDER — PRENATAL MULTIVITAMIN CH
1.0000 | ORAL_TABLET | Freq: Every day | ORAL | Status: DC
  Administered 2021-04-18 – 2021-04-20 (×3): 1 via ORAL
  Filled 2021-04-18 (×4): qty 1

## 2021-04-18 MED ORDER — DIPHENHYDRAMINE HCL 25 MG PO CAPS: 25.0000 mg | ORAL_CAPSULE | Freq: Four times a day (QID) | ORAL | Status: DC | PRN

## 2021-04-18 MED ORDER — SIMETHICONE 80 MG PO CHEW: 80.0000 mg | CHEWABLE_TABLET | ORAL | Status: DC | PRN

## 2021-04-18 MED ORDER — DIBUCAINE (PERIANAL) 1 % EX OINT: 1.0000 "application " | TOPICAL_OINTMENT | CUTANEOUS | Status: DC | PRN

## 2021-04-18 MED ORDER — ONDANSETRON HCL 4 MG PO TABS: 4.0000 mg | ORAL_TABLET | ORAL | Status: DC | PRN

## 2021-04-18 MED ORDER — BENZOCAINE-MENTHOL 20-0.5 % EX AERO
1.0000 "application " | INHALATION_SPRAY | CUTANEOUS | Status: DC | PRN
  Administered 2021-04-18 – 2021-04-19 (×2): 1 via TOPICAL
  Filled 2021-04-18 (×2): qty 56

## 2021-04-18 MED ORDER — SENNOSIDES-DOCUSATE SODIUM 8.6-50 MG PO TABS
2.0000 | ORAL_TABLET | Freq: Every day | ORAL | Status: DC
  Administered 2021-04-19 – 2021-04-20 (×2): 2 via ORAL
  Filled 2021-04-18 (×2): qty 2

## 2021-04-18 MED ORDER — COCONUT OIL OIL: 1.0000 "application " | TOPICAL_OIL | Status: DC | PRN

## 2021-04-18 MED ORDER — TETANUS-DIPHTH-ACELL PERTUSSIS 5-2.5-18.5 LF-MCG/0.5 IM SUSY: 0.5000 mL | PREFILLED_SYRINGE | Freq: Once | INTRAMUSCULAR | Status: DC

## 2021-04-18 MED ORDER — WITCH HAZEL-GLYCERIN EX PADS: 1.0000 "application " | MEDICATED_PAD | CUTANEOUS | Status: DC | PRN

## 2021-04-18 MED ORDER — ONDANSETRON HCL 4 MG/2ML IJ SOLN: 4.0000 mg | INTRAMUSCULAR | Status: DC | PRN

## 2021-04-18 MED ORDER — IBUPROFEN 600 MG PO TABS
600.0000 mg | ORAL_TABLET | Freq: Four times a day (QID) | ORAL | Status: DC
  Administered 2021-04-18 – 2021-04-20 (×9): 600 mg via ORAL
  Filled 2021-04-18 (×10): qty 1

## 2021-04-18 MED ORDER — ZOLPIDEM TARTRATE 5 MG PO TABS: 5.0000 mg | ORAL_TABLET | Freq: Every evening | ORAL | Status: DC | PRN

## 2021-04-18 NOTE — Progress Notes (Signed)
Patient was able to walk to restroom and void

## 2021-04-18 NOTE — Lactation Note (Signed)
This note was copied from a baby's chart. Lactation Consultation Note  Patient Name: Heather Brock [id number] Date: 04/18/2021 Reason for consult: Initial assessment;Early term 37-38.6wks;Primapara;1st time breastfeeding Age:31 hours   P1 mother whose infant is now 65 hours old.  This is an ETI at 37+5 weeks.    Mother had baby latched to the right breast in the football hold when I arrived.  Mother was using a NS.  Mother was exhausted but requested assistance.  She was having a hard time staying awake.  Baby restless at the breast.  Removed baby from the breast.  Upon assessment, I do not feel a need to use a NS at this time.  Mother's nipples are short shafted; slight edema noted.  I suggested attempting to latch without the NS.  Baby latched and fed for another few minutes before self releasing.  Burped and swaddled.  Placed him in the bassinet for mother.  Reviewed basics, however, mother will require hand expression teaching and close follow up with latching at the next feeding.  Father present.     Maternal Data Has patient been taught Hand Expression?: No Does the patient have breastfeeding experience prior to this delivery?: No  Feeding Mother's Current Feeding Choice: Breast Milk  LATCH Score Latch: Repeated attempts needed to sustain latch, nipple held in mouth throughout feeding, stimulation needed to elicit sucking reflex.  Audible Swallowing: None  Type of Nipple: Everted at rest and after stimulation (Short shafted)  Comfort (Breast/Nipple): Soft / non-tender  Hold (Positioning): Assistance needed to correctly position infant at breast and maintain latch.  LATCH Score: 6   Lactation Tools Discussed/Used Tools: Nipple Shields Nipple shield size: 20  Interventions Interventions: Breast feeding basics reviewed;Assisted with latch;Skin to skin;Adjust position;Support pillows;Education  Discharge    Consult Status Consult Status: Follow-up Date: 04/18/21 Follow-up  type: In-patient    Liesa Tsan R Tabari Volkert 04/18/2021, 6:09 AM

## 2021-04-18 NOTE — Lactation Note (Signed)
This note was copied from a baby's chart. Lactation Consultation Note  Patient Name: Heather Brock [id number] Date: 04/18/2021 Reason for consult: Follow-up assessment;Mother's request;Difficult latch;Early term 37-38.6wks;Nipple pain/trauma Age:31 hours  LC received a call from RN, Debbe Mounts, Mom struggling latching infant on left breast with help of 20 NS. Infant still showing cues and extended feeds 60 min.   [place] worked with Mom to latch with greater depth in football with increase in depth of swallows with breast compression.   Plan 1. To feed based on cues 8-12x 24 hr period. Mom to offer breast first and if needed apply 20 NS.  2. If unable to latch, Mom to offer EBM via spoon 3. DEBP q 3 hrs for 15 min    RN stated Mom declined use of formula or DBM at this time. Mom wants to pump and supplement with her EBM. Mom exhausted. Infant fell asleep at end of feeding.   If Mom not able to have enough supplement with EBM, RN to discuss with Mom use of DBM at that time. Mom open to supplement if she does not have enough volume from pumping.   Maternal Data Has patient been taught Hand Expression?: Yes  Feeding Mother's Current Feeding Choice: Breast Milk  LATCH Score Latch: Repeated attempts needed to sustain latch, nipple held in mouth throughout feeding, stimulation needed to elicit sucking reflex.  Audible Swallowing: A few with stimulation  Type of Nipple: Flat  Comfort (Breast/Nipple): Filling, red/small blisters or bruises, mild/mod discomfort  Hold (Positioning): Assistance needed to correctly position infant at breast and maintain latch.  LATCH Score: 5   Lactation Tools Discussed/Used Tools: Nipple [place] Fuel;Flanges;Pump;Coconut oil Nipple shield size: 20 Flange Size: 24 Breast pump type: Double-Electric Breast Pump Pump Education: Setup, frequency, and cleaning;Milk Storage Reason for Pumping: increase stimulation Pumping frequency: every 3 hrs for 15  min  Interventions Interventions: Breast feeding basics reviewed;Assisted with latch;Skin to skin;Support pillows;DEBP;Position options;Breast massage;Hand express;Expressed milk;Education  Discharge    Consult Status Consult Status: Follow-up Date: 04/19/21 Follow-up type: In-patient    Jivan Symanski  [place] 04/18/2021, 6:05 PM

## 2021-04-18 NOTE — Discharge Summary (Addendum)
Postpartum Discharge Summary     Patient Name: Heather Brock DOB: 05-26-1991 MRN: [id number]  Date of admission: 04/16/2021 Delivery date:04/18/2021  Delivering provider: Jim Like B  Date of discharge: 04/20/2021  Admitting diagnosis: Indication for care in labor or delivery [O75.9] Intrauterine pregnancy: [redacted]w[redacted]d    Secondary diagnosis:  Active Problems:   Uterine fibroid during pregnancy, antepartum   Rubella non-immune status, antepartum   LGSIL on Pap smear of cervix   Indication for care in labor or delivery  Additional problems: acute blood loss anemia, asymptomatic    Discharge diagnosis: Term Pregnancy Delivered                                              Post partum procedures: None Augmentation: Pitocin Complications: RWLS>93hours  Hospital course: Onset of Labor With Vaginal Delivery      30y.o. yo [id number] 327w5das admitted in Latent Labor s/p SROM (around 7am on 9/11)on 04/16/2021. Patient had an uncomplicated labor course as follows:  Membrane Rupture Time/Date: 7:00 AM ,04/16/2021   Delivery Method:Vaginal, Spontaneous  Episiotomy: None  Lacerations:  1st degree;Perineal  Patient had an uncomplicated postpartum course.  She was started on every other day ferrous sulfate for anemia as above (asymptomatic). She is ambulating, tolerating a regular diet, passing flatus, and urinating well. Patient is discharged home in stable condition on 04/20/21.  Newborn Data: Birth date:04/18/2021  Birth time:12:20 AM  Gender:Female  Living status:Living  Apgars:8 ,9  Weight:3635 g   Magnesium Sulfate received: No BMZ received: No Rhophylac:No MMR:No T-DaP:Given prenatally Flu: N/A Transfusion:No  Physical exam  Vitals:   04/18/21 2007 04/19/21 1500 04/19/21 1948 04/20/21 0532  BP: (!) 98/56 119/79 121/76   Pulse: 100 87 96 93  Resp: '16 18  17  ' Temp: 97.8 F (36.6 C) 97.7 F (36.5 C)  98 F (36.7 C)  TempSrc: Oral Oral Oral Oral  SpO2: 100% 100%  96%    General: alert, cooperative, and no distress Lochia: appropriate Uterine Fundus: firm DVT Evaluation: No evidence of DVT seen on physical exam. Labs: Lab Results  Component Value Date   WBC 22.9 (H) 04/18/2021   HGB 8.3 (L) 04/18/2021   HCT 25.4 (L) 04/18/2021   MCV 85.5 04/18/2021   PLT 228 04/18/2021   No flowsheet data found. Edinburgh Score: Edinburgh Postnatal Depression Scale Screening Tool 04/19/2021  I have been able to laugh and see the funny side of things. 0  I have looked forward with enjoyment to things. 0  I have blamed myself unnecessarily when things went wrong. 1  I have been anxious or worried for no good reason. 2  I have felt scared or panicky for no good reason. 0  Things have been getting on top of me. 1  I have been so unhappy that I have had difficulty sleeping. 1  I have felt sad or miserable. 0  I have been so unhappy that I have been crying. 0  The thought of harming myself has occurred to me. 0  Edinburgh Postnatal Depression Scale Total 5     After visit meds:  Allergies as of 04/20/2021       Reactions   Latex    Irritation with condoms        Medication List     TAKE these medications  acetaminophen 325 MG tablet Commonly known as: Tylenol Take 2 tablets (650 mg total) by mouth every 4 (four) hours as needed (for pain scale < 4).   ferrous sulfate 325 (65 FE) MG tablet Take 1 tablet (325 mg total) by mouth every other day. Start taking on: April 21, 2021   ibuprofen 200 MG tablet Commonly known as: ADVIL Take 3 tablets (600 mg total) by mouth every 6 (six) hours.   Prenatal Vitamins 28-0.8 MG Tabs Take 1 tablet by mouth daily.         Discharge home in stable condition Infant Feeding: Bottle and Breast Infant Disposition:home with mother Discharge instruction: per After Visit Summary and Postpartum booklet. Activity: Advance as tolerated. Pelvic rest for 6 weeks.  Diet: routine diet Future  Appointments: Future Appointments  Date Time Provider [place]  05/15/2021  3:15 PM Donnamae Jude, MD [facility]   Follow up Visit:  Message sent to [facility] by Dr. Higinio Plan on 04/18/2021:   Please schedule this patient for a In person postpartum visit in 4 weeks with the following provider: Any provider. Additional Postpartum F/U:[place] (LGSIL on papsmear in 11/2020)  Low risk pregnancy complicated by:  None/anxiety Delivery mode:  Vaginal, Spontaneous  Anticipated Birth Control:  Unsure   04/20/2021 Pearla Dubonnet, MD  Family Medicine Pgy-1   GME ATTESTATION:  I saw and evaluated the patient. I agree with the findings and the plan of care as documented in the resident's note.  Darrelyn Hillock, DO OB Fellow, [place] for [place] 04/20/2021 7:13 AM

## 2021-04-19 MED ORDER — FERROUS SULFATE 325 (65 FE) MG PO TABS
325.0000 mg | ORAL_TABLET | ORAL | Status: DC
  Administered 2021-04-19: 325 mg via ORAL
  Filled 2021-04-19: qty 1

## 2021-04-19 MED ORDER — MEASLES, MUMPS & RUBELLA VAC IJ SOLR
0.5000 mL | Freq: Once | INTRAMUSCULAR | Status: AC
  Administered 2021-04-20: 0.5 mL via SUBCUTANEOUS
  Filled 2021-04-19: qty 0.5

## 2021-04-19 NOTE — Progress Notes (Signed)
Patient ID: Heather Brock, female   DOB: 1990/08/31, 31 y.o.   MRN: [id number]  Post Partum Day 1 Subjective: no complaints, up ad lib, voiding, tolerating PO, + flatus, and states pain is 5/10 and controlled with Tylenol and ibuprofen. She denies any dizziness or SOB. She would like to continue lactation support, as she feels it has been hard to breast feed.  Objective: Blood pressure (!) 98/56, pulse 100, temperature 97.8 F (36.6 C), temperature source Oral, resp. rate 16, last menstrual period 07/28/2020, SpO2 100 %, unknown if currently breastfeeding.  Physical Exam:  General: alert, cooperative, appears stated age, and no distress Lochia: appropriate Uterine Fundus: firm DVT Evaluation: No evidence of DVT seen on physical exam. No cords or calf tenderness. No significant calf/ankle edema.  Recent Labs    04/16/21 1656 04/18/21 0454  HGB 10.4* 8.3*  HCT 32.4* 25.4*   Assessment/Plan: Patient is overall doing well and meeting all milestones. Hgb 8.3 on recent CBC, but given patient is symptomatic, will give oral ferrous sulfate 325 mg every other day. Will plan for discharge tomorrow.   LOS: 3 days   Mikal Plane 04/19/2021, 6:43 AM

## 2021-04-19 NOTE — Social Work (Signed)
CSW received consult for hx of Severe Anxiety and THC use.  CSW met with MOB to offer support and complete assessment.    CSW met with MOB at bedside and introduced CSW role. CSW observed MOB skin to skin with the infant and FOB present at bedside. MOB presented calm and receptive to [place] visit. MOB welcomed CSW to complete assessment with FOB present. MOB informed CSW she will discharge to her mom's home at [place], [place] [address] for the next five days and then transition an apartment but does not have the exact address. CSW inquired how MOB has felt since giving birth. MOB shared she feels much better today because yesterday she felt exhausted after a long labor and delivery. CSW inquired about history of anxiety. MOB denied having anxiety although she stated that she experienced anxiety during pregnancy due to her high-risk pregnancy. She also reported feeling lonely and sad some days but believes the emotions were due to her hormones. MOB reported she has not had any concerns with anxiety since giving birth. CSW inquired about MOB coping skills. MOB reported she copes by eating her favorite meals and trying to be self-aware of her emotions. CSW praised MOB for her efforts. CSW inquired about MOB supports. MOB identified FOB, her parents and in laws, best friend and in laws as support. CSW provided education regarding the baby blues period vs. perinatal mood disorders, discussed treatment and gave resources for mental health follow up if concerns arise.  CSW recommended MOB complete a self-evaluation during the postpartum time period using the New Mom Checklist from Postpartum Progress and encouraged MOB to contact a medical professional if symptoms are noted at any time. MOB reported she feels comfortable reaching out to her doctor if she has concerns. CSW assessed MOB for safety. MOB denied thoughts of harm to self and others.   CSW provided review of Sudden Infant Death Syndrome (SIDS)  precautions. MOB shared she has essential items for the infant including a car seat and bassinet where the infant will sleep. MOB has chosen [facility] for infant's follow up care. MOB shared she receives [facility] and is actively applying for FS. MOB presented questions about Medicaid. CSW informed MOB per [facility] financial counselor because she has Medicaid the infant will receive Medicaid as well. CSW assessed MOB for additional needs. MOB reported no further needs.   CSW received consult for hx of marijuana use.  Referral was screened out due to the following: Per chart review, it noted MOB stopped using in Jan.2022.  ~MOB had no documented substance use after initial prenatal visit/+UPT. ~MOB had no positive drug screens after initial prenatal visit/+UPT. ~Baby's UDS is negative.  CSW will monitor CDS results and make report to Child Protective Services if warranted.   CSW identifies no further need for intervention and no barriers to discharge at this time.    Kathrin Greathouse, MSW, LCSW Women's and [place] Worker  [phone] 04/19/2021  10:17 AM

## 2021-04-19 NOTE — Lactation Note (Addendum)
This note was copied from a baby's chart. Lactation Consultation Note  Patient Name: Boy Lanny Nayar [id number] Date: 04/19/2021 Reason for consult: Follow-up assessment;Difficult latch;Early term 37-38.6wks;Nipple pain/trauma Age:31 hours  LC in to visit with P60 Mom of ET infant.  Baby at 1.2% weight loss and TcB is elevated in high risk zone, serum bilirubin to be drawn.  Mom has been struggling to latch baby to the breast and started to supplement with formula by bottle.   Mom's left nipple has a crack and Mom states she had some bleeding.  Mom to use colostrum onto nipple for healing.    Mom has a DEBP set up but has pumped once only.  Talked about the importance of breast stimulation to support a full milk supply.    Baby sleeping swaddled.  Offered to try to latch baby with assistance.  Baby woke easily and became fussy.  Laid Mom back and placed baby prone on Mom's right breast.  Baby's mouth small and he is not opening his mouth widely, assisted Mom with using breast support and sandwiching.  After baby started showing interest in latching, placed pillow under baby and sat Mom up.  Mom to use cross cradle hold.  Baby repeatedly on and off the breast.  Initiated a 20 mm nipple shield, nipple pulled well into shield.  Baby had to be enticed to open his mouth and once he latched, he just sat there.  Mom very hungry and wants to shower.  Sized Mom with a 21 mm flange for a better fit (Mom was complaining about leaking when she pumped)    Mom to pump on initiation setting and shower after eating.   Recommended Mom place baby STS on her chest after her shower and call for LC with feeding cues.   LATCH Score Latch: Repeated attempts needed to sustain latch, nipple held in mouth throughout feeding, stimulation needed to elicit sucking reflex.  Audible Swallowing: None  Type of Nipple: Everted at rest and after stimulation (right side more everted and compressible)  Comfort (Breast/Nipple):  Filling, red/small blisters or bruises, mild/mod discomfort  Hold (Positioning): Assistance needed to correctly position infant at breast and maintain latch.  LATCH Score: 5   Lactation Tools Discussed/Used Tools: Nipple Shields;Pump;Flanges Nipple shield size: 20 Flange Size: 21 Breast pump type: Double-Electric Breast Pump Pump Education: Setup, frequency, and cleaning;Milk Storage Reason for Pumping: Support milk supply/difficult latch Pumping frequency: X 1 Pumped volume: 0 mL  Interventions Interventions: Breast feeding basics reviewed;Assisted with latch;Skin to skin;Breast massage;Hand express;Pre-pump if needed;Adjust position;Support pillows;Position options;Hand pump;DEBP  Discharge [facility] Program: Yes  Consult Status Consult Status: Follow-up Date: 04/20/21 Follow-up type: In-patient    Broadus John 04/19/2021, 2:43 PM

## 2021-04-20 MED ORDER — FERROUS SULFATE 325 (65 FE) MG PO TABS: 325.0000 mg | ORAL_TABLET | ORAL | 3 refills | Status: DC

## 2021-04-20 MED ORDER — IBUPROFEN 200 MG PO TABS: 600.0000 mg | ORAL_TABLET | Freq: Four times a day (QID) | ORAL | Status: DC

## 2021-04-20 MED ORDER — ACETAMINOPHEN 325 MG PO TABS: 650.0000 mg | ORAL_TABLET | ORAL | Status: DC | PRN

## 2021-04-20 NOTE — Lactation Note (Addendum)
This note was copied from a baby's chart. Lactation Consultation Note  Patient Name: Boy Julisia Fasolino [id number] Date: 04/20/2021 Reason for consult: Follow-up assessment;Mother's request;Difficult latch;Early term 37-38.6wks;Infant weight loss;Hyperbilirubinemia Age:31 hours  Mom not latching infant on left side due to inverted nipple. Breasts are soft but no signs of hardness or plugged ducts or trauma.  Mom fed formula 1 hr prior 35 ml.    LC applied 20 NS and assisted with latching on left.  BF supplementation guide provided reviewed volumes for feeding if infant not latching Mom to offer more.  Plan 1. To feed based on cues 8-12x 24 hr period.  2. Mom to offer breast and if needed apply 20 NS on left side only.  3. Mom to supplement with EBM first followed by formula with extra slow flow nipple and pace bottle feeding.  4. Mom to pump with manual pump for now until she gets the DEBP from [facility].  5. [place] brochure of inpatient and outpatient services reviewed.  All questions answered at the end of the visit.   [place] reviewed feeding plan with RN, Marissa Mabe  Maternal Data Has patient been taught Hand Expression?: Yes  Feeding Mother's Current Feeding Choice: Breast Milk and Formula Nipple Type: Extra Slow Flow  LATCH Score Latch: Repeated attempts needed to sustain latch, nipple held in mouth throughout feeding, stimulation needed to elicit sucking reflex.  Audible Swallowing: Spontaneous and intermittent  Type of Nipple: Inverted  Comfort (Breast/Nipple): Soft / non-tender  Hold (Positioning): Assistance needed to correctly position infant at breast and maintain latch.  LATCH Score: 6   Lactation Tools Discussed/Used Tools: Nipple Shields;Pump;Flanges;Shells (help elongate her nipples left more than right. Mom aware to wear breast shells not nursing, sleeping or pumping. Mom to wash in between use.) Nipple shield size: 20 (Mom left nipple inverted and compressible. Mom not latched  infant on left as a result. 20 NS provided to assist with latching on left.) Flange Size: 21 Breast pump type: Manual Pump Education: Setup, frequency, and cleaning;Milk Storage Reason for Pumping: increase stimulation Pumping frequency: every 3 hrs for 10 min each side (Mom to get electric breast pump from [facility] upon discharge.)  Interventions Interventions: Breast feeding basics reviewed;Breast compression;Assisted with latch;Adjust position;Skin to skin;Support pillows;DEBP;Breast massage;Position options;Hand express;Expressed milk;Education;Pace feeding;Shells  Discharge Discharge Education: Engorgement and breast care;Warning signs for feeding baby Pump: Manual  Consult Status Consult Status: Follow-up Date: 04/20/21 Follow-up type: In-patient    Ursala Cressy  N[place] 04/20/2021, 2:51 PM

## 2021-04-21 ENCOUNTER — Ambulatory Visit: Payer: Self-pay

## 2021-04-21 NOTE — Lactation Note (Signed)
This note was copied from a baby's chart. Lactation Consultation Note  Patient Name: Heather Brock [id number] Date: 04/21/2021   Age:31 days LC discussed how to use WIC  ( weekend) [place] with mom, mom issued DEBP [phone], mom did not have change she is  to receive $40.00 once pump is returned to [facility].   Maternal Data    Feeding Nipple Type: Extra Slow Flow  LATCH Score                    Lactation Tools Discussed/Used    Interventions    Discharge    Consult Status      [name] 04/21/2021, 7:20 PM

## 2021-04-21 NOTE — Lactation Note (Addendum)
This note was copied from a baby's chart. Lactation Consultation Note  Patient Name: Heather Brock [id number] Date: 04/21/2021 Reason for consult: Follow-up assessment;1st time breastfeeding;Primapara;Early term 37-38.6wks;Infant weight loss Age:31 days/ baby off double photo this am per mom has a Bili test at 4 pm.  LC changed 3 wets and 3 medium sized transitional stools at this consult ( see doc flow sheets) .  Mom receptive to latching the baby / attempted/ Latch score 6 ( will need work on latching and mom aware). Mom fed the baby EBM from a bottle ( purple nipple ) .  LC reviewed BF D/C teaching / see below.  Mom actually refers the hand pump for pumping / and does well.  LC reviewed supply and demand and explained when using a NS to latch and DL / DEBP is less time consuming / 20 mins both compared to 15 mins each with a hand pump. Mom receptive to a DEBP .  Since the repeat Bili is not until 4pm / mom will miss her 1pm WIC appt.  LC explained the [facility] loaner process and mom will check if dad for $30.oo.   LC offered to request and LC O/P appt am and mom receptive. [place] sent a request.     Maternal Data Has patient been taught Hand Expression?: Yes  Feeding Mother's Current Feeding Choice: Breast Milk and Formula Nipple Type: Extra Slow Flow  LATCH Score Latch: Repeated attempts needed to sustain latch, nipple held in mouth throughout feeding, stimulation needed to elicit sucking reflex.  Audible Swallowing: A few with stimulation  Type of Nipple: Everted at rest and after stimulation  Comfort (Breast/Nipple): Filling, red/small blisters or bruises, mild/mod discomfort  Hold (Positioning): Assistance needed to correctly position infant at breast and maintain latch.  LATCH Score: 6   Lactation Tools Discussed/Used Tools: Shells;Pump;Flanges Nipple shield size: 20 (did not use the NS) Flange Size: 21;24 Breast pump type: Manual;Double-Electric Breast Pump Pump Education: Milk  Storage;Setup, frequency, and cleaning;Other (comment) (LC reviewed)  Interventions Interventions: Breast feeding basics reviewed;Assisted with latch;Skin to skin;Breast massage;Hand express;Pre-pump if needed;Reverse pressure;Breast compression;Support pillows;Adjust position;Position options;Expressed milk;Shells;Hand pump;Education;DEBP  Discharge Discharge Education: Engorgement and breast care;Warning signs for feeding baby Pump: Manual WIC Program: Yes  Consult Status Consult Status: Follow-up Date: 04/21/21 Follow-up type: In-patient    [place] 04/21/2021, 12:31 PM

## 2021-05-02 ENCOUNTER — Telehealth (HOSPITAL_COMMUNITY): Payer: Self-pay | Admitting: *Deleted

## 2021-05-02 NOTE — Telephone Encounter (Signed)
Patient voiced no questions or concerns regarding her own health. EPDS = 4. Patient voiced no questions or concerns regarding baby at this time. Patient reported infant sleeps in a bassinet on his back. RN reviewed ABCs of safe sleep - patient verbalized understanding. Erline Levine, RN, 05/02/21, 1758.

## 2021-05-04 ENCOUNTER — Inpatient Hospital Stay (HOSPITAL_COMMUNITY): Admit: 2021-05-04 | Payer: Self-pay

## 2021-05-15 ENCOUNTER — Ambulatory Visit (INDEPENDENT_AMBULATORY_CARE_PROVIDER_SITE_OTHER): Payer: Medicaid Other | Admitting: Family Medicine

## 2021-05-15 ENCOUNTER — Other Ambulatory Visit: Payer: Self-pay

## 2021-05-15 ENCOUNTER — Encounter: Payer: Self-pay | Admitting: Family Medicine

## 2021-05-15 ENCOUNTER — Other Ambulatory Visit (HOSPITAL_COMMUNITY)
Admission: RE | Admit: 2021-05-15 | Discharge: 2021-05-15 | Disposition: A | Discharge status: Home / Self Care | Payer: Medicaid Other | Source: Ambulatory Visit | Attending: Family Medicine | Admitting: Family Medicine

## 2021-05-15 VITALS — BP 124/78 | HR 73 | Wt 168.0 lb

## 2021-05-15 DIAGNOSIS — Z3202 Encounter for pregnancy test, result negative: Secondary | ICD-10-CM | POA: Diagnosis not present

## 2021-05-15 DIAGNOSIS — Z23 Encounter for immunization: Secondary | ICD-10-CM

## 2021-05-15 DIAGNOSIS — R87612 Low grade squamous intraepithelial lesion on cytologic smear of cervix (LGSIL): Secondary | ICD-10-CM | POA: Insufficient documentation

## 2021-05-15 LAB — POCT PREGNANCY, URINE: Preg Test, Ur: NEGATIVE

## 2021-05-15 NOTE — Progress Notes (Signed)
[place] Partum Visit Note  Heather Brock is a 30 y.o. G39P1021 female who presents for a postpartum visit. She is 4 weeks postpartum following a normal spontaneous vaginal delivery.  I have fully reviewed the prenatal and intrapartum course. The delivery was at [redacted]w[redacted]d gestational weeks.  Anesthesia:  Nitrous oxide . Postpartum course has been normal. Baby is doing well. Baby is feeding by both breast and bottle - Similac 360 . Bleeding staining only. Bowel function is normal. Bladder function is normal. Patient is not sexually active. Contraception method is none. Postpartum depression screening: negative.   Edinburgh Postnatal Depression Scale - 05/15/21 1547       Edinburgh Postnatal Depression Scale:  In the Past 7 Days   I have been able to laugh and see the funny side of things. 0    I have looked forward with enjoyment to things. 0    I have blamed myself unnecessarily when things went wrong. 0    I have been anxious or worried for no good reason. 0    I have felt scared or panicky for no good reason. 0    Things have been getting on top of me. 0    I have been so unhappy that I have had difficulty sleeping. 0    I have felt sad or miserable. 0    I have been so unhappy that I have been crying. 0    The thought of harming myself has occurred to me. 0    Edinburgh Postnatal Depression Scale Total 0              The pregnancy intention screening data noted above was reviewed. Potential methods of contraception were discussed. The patient elected to proceed with No data recorded.    Health Maintenance Due  Topic Date Due   COVID-19 Vaccine (1) Never done   INFLUENZA VACCINE  Never done    The following portions of the patient's history were reviewed and updated as appropriate: allergies, current medications, past family history, past medical history, past social history, past surgical history, and problem list.  Review of Systems Pertinent items noted in HPI and remainder of  comprehensive ROS otherwise negative.  Objective:  BP 124/78   Pulse 73   Wt 168 lb (76.2 kg)   Breastfeeding Yes   BMI 30.73 kg/m    General:  alert, cooperative, and appears stated age  Lungs: Normal effort  Heart:  regular rate and rhythm, S1, S2 normal, no murmur, click, rub or gallop  Abdomen: soft, non-tender; bowel sounds normal; no masses,  no organomegaly   GU exam:  normal        Procedure:  Patient given informed consent, signed copy in the chart, time out was performed.  Placed in lithotomy position. Cervix viewed with speculum and colposcope after application of acetic acid.   Colposcopy adequate?  yes Acetowhite lesions?entire SCJ Punctation?yes Mosaicism?  no Abnormal vasculature?  no Biopsies?9,12,6 o'clock ECC?yes  COMMENTS: Patient was given post procedure instructions.  She will be called with results  Assessment:    Normal postpartum exam.   Plan:   Essential components of care per ACOG recommendations:  1.  Mood and well being: Patient with negative depression screening today. Reviewed local resources for support.  - Patient tobacco use? No.   - hx of drug use? No.  Not currently  2. Infant care and feeding:  -Patient currently breastmilk feeding? Yes. Reviewed importance of draining breast regularly to  support lactation.  -Social determinants of health (SDOH) reviewed in EPIC. No concerns  3. Sexuality, contraception and birth spacing - Patient does not want a pregnancy in the next year.  Desired family size is 2 children.  - Reviewed forms of contraception in tiered fashion. Patient desired no method today.   - Discussed birth spacing of 18 months  4. Sleep and fatigue -Encouraged family/partner/community support of 4 hrs of uninterrupted sleep to help with mood and fatigue  5. Physical Recovery  - Discussed patients delivery and complications. She describes her labor as mixed. - Patient had a Vaginal, no problems at delivery. Patient  had a 1st degree laceration. Perineal healing reviewed. Patient expressed understanding - Patient has urinary incontinence? No. - Patient is safe to resume physical and sexual activity  6.  Health Maintenance - HM due items addressed Yes - Last pap smear  Diagnosis  Date Value Ref Range Status  11/17/2020 - Low grade squamous intraepithelial lesion (LSIL) (A)  Final   Pap smear not done at today's visit.  S/p colpo today -Breast Cancer screening indicated? No.   7. Chronic Disease/Pregnancy Condition follow up: None  - PCP follow up  Donnamae Jude, [place] for [place], [place]

## 2021-05-17 LAB — SURGICAL PATHOLOGY

## 2021-05-18 ENCOUNTER — Telehealth: Payer: Self-pay

## 2021-05-18 NOTE — Telephone Encounter (Signed)
Call placed to pt. Spoke with pt. Pt given results and recommendations per Dr Kennon Rounds. Pt verbalized understanding and agreeable to plan of care.  Recall placed [facility]

## 2021-05-18 NOTE — Telephone Encounter (Signed)
-----   Message from Donnamae Jude, MD sent at 05/18/2021  8:17 AM EDT ----- Recall pap in 1 year.

## 2021-07-17 ENCOUNTER — Other Ambulatory Visit: Payer: Self-pay

## 2021-07-17 ENCOUNTER — Ambulatory Visit (INDEPENDENT_AMBULATORY_CARE_PROVIDER_SITE_OTHER): Payer: Medicaid Other

## 2021-07-17 VITALS — BP 114/74 | HR 84 | Wt 167.2 lb

## 2021-07-17 DIAGNOSIS — Z23 Encounter for immunization: Secondary | ICD-10-CM

## 2021-07-17 NOTE — Progress Notes (Signed)
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  Injection administered without complication. Patient will return in  4 months  for next injection.  Annabell Howells, RN 07/17/2021  3:00 PM

## 2021-07-20 NOTE — Progress Notes (Signed)
Attestation of Attending Supervision of clinical support staff: I agree with the care provided to this patient and was available for any consultation.  I have reviewed the RN's note and chart. I was available for consult and to see the patient if needed.   Vaishali Baise MD MPH Attending Physician Faculty Practice- [facility]  

## 2021-08-06 NOTE — L&D Delivery Note (Incomplete)
OB/GYN Faculty Practice Delivery Note  Heather Brock is a 31 y.o. [id number] status post SVD with 1 minute 40 seconds shoulder dystocia at [redacted]w[redacted]d She was admitted for IOL for GDM, LGA.  ROM: 3h 476mith clear fluid GBS Status: Negative/-- (12/18 1705) Maximum Maternal Temperature: Temp (24hrs), Avg:98 F (36.7 C), Min:97.6 F (36.4 C), Max:98.2 F (36.8 C)  Labor Progress: Patient arrived at 3.5 cm dilation and was induced with Pitocin and had a normal labor course and curve. .   Delivery Date/Time: 07/29/2022 at 2217 Delivery: Dr. CrCaron Presumeas called to room and patient was complete and pushing; at this time, Dr. PiIlda Bassetas informed and he was present for delivery, at the nurses station. The patient pushed well for about 15 minutes, and then the baby's head delivered in direct OA position with restitution to ROA position. No nuchal cord present.  Shoulder dystocia diagnosed and the attending and Peds were  immediately called to the room.  Patient instructed to stop pushing, and McRoberts and suprapubic pressure applied without resolution of the shoulder dystocia. ; wood screw then applied with minimal rotation.  At this point, Dr. PiIlda Bassetrrived and he was able to deliver the posterior arm; total time for shoulder dystocia was approximately 1 minute and 40 seconds  Infant without spontaneous cry so cord was immediately clamped and cut.  Arterial and venous blood gases obtained.  Cord blood drawn.  Placenta did not deliver spontaneously.  Cord avulsion occurred and manual placental removal was required.  Patient given dose of Ancef after manual placental extraction.  Fundus firm with massage and Pitocin. Labia, perineum, vagina, and cervix inspected with third-degree perineal laceration (3c)  and confirmed on rectal exam.  Patient is unmedicated and will be taken to the OR for repair of the laceration..   Placenta: Not spontaneous, removed in pieces via manual extraction. Placenta inspected and no missing  pieces identified and uterine sweep felt clean. Three-vessel cord Complications: Shoulder dystocia, manual placental extraction Lacerations: Third-degree (3c) perineal laceration EBL: [id number]: None  Infant: APGAR (1 MIN): 3   APGAR (5 MINS): 8   APGAR (10 MINS): 9    Weight: 9 pounds 10 ounces, 4370gm  Cord gases Arterial pH 7.31, Bicarbonate 24.7, pCO2 49 Venous pH 7.38, Bicarbonate 23.7, pCO2 40  VicGifford ShaveD  OB fellow 07/29/2022 10:52 PM  Attestation of Attending Supervision of Fellow: Evaluation and management procedures were performed by the fellow under my supervision.  I have seen and examined the patient,  reviewed the fellow's note and chart, and I agree with the management and plan.   ChaDurene Romans Attending [facility] [employer] manager

## 2021-09-30 IMAGING — US US MFM OB COMP +14 WKS
1 series · 13 of 28 positions shown · non-contrast
Comparison: none

[Series 1: us mfm ob comp +14 wks · 13 of 70 slices shown]
[im 3/70]
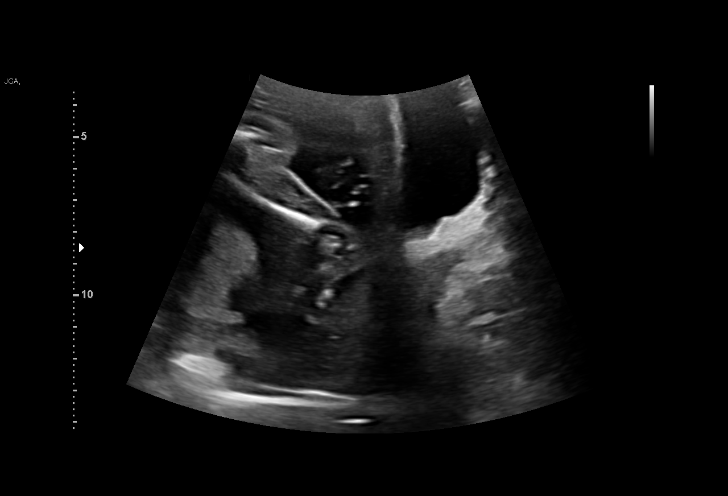
[im 8/70]
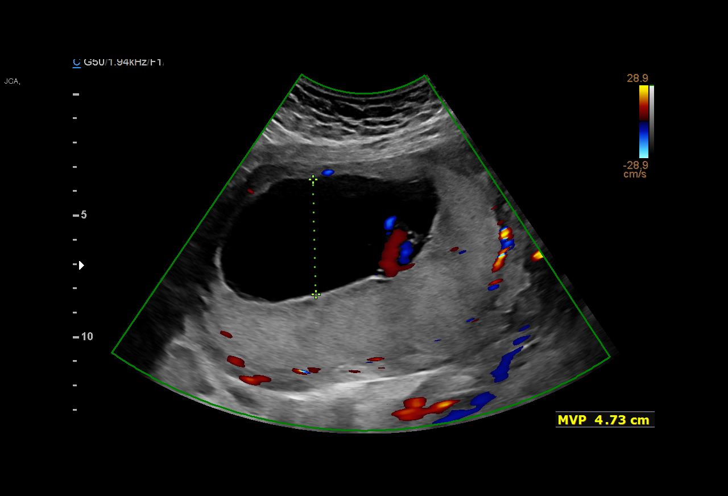
[im 13/70]
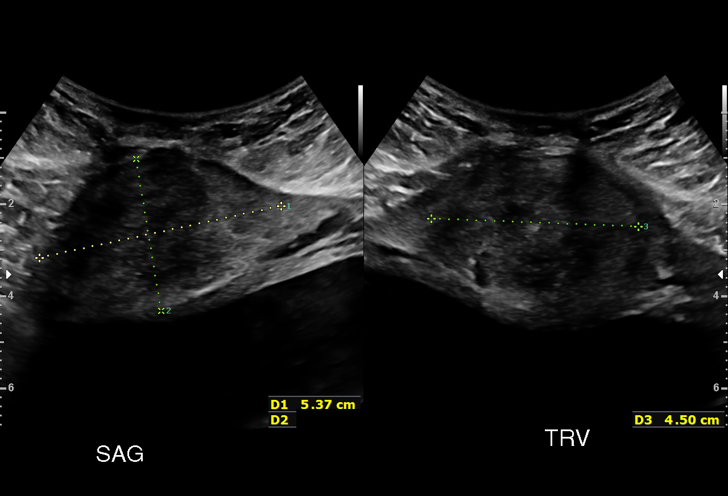
[im 18/70]
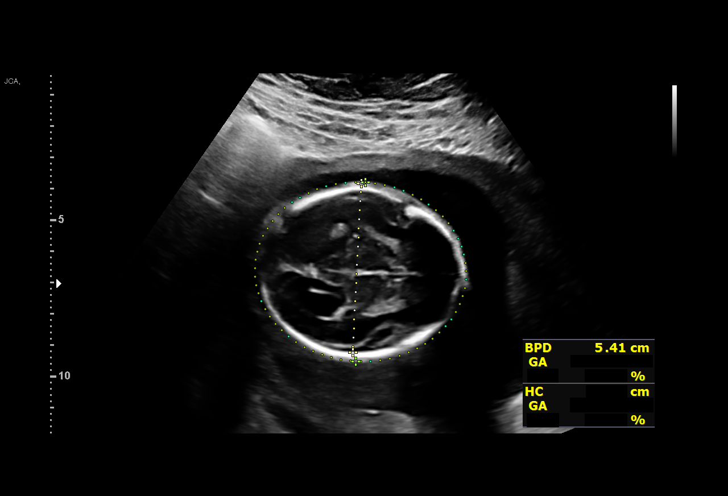
[im 24/70]
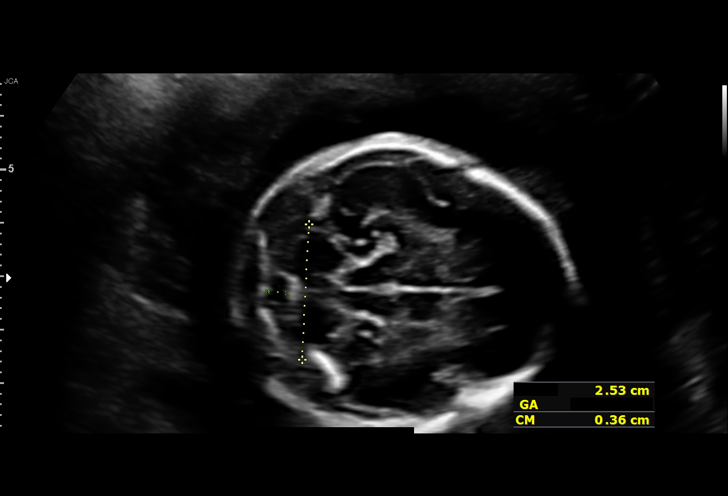
[im 29/70]
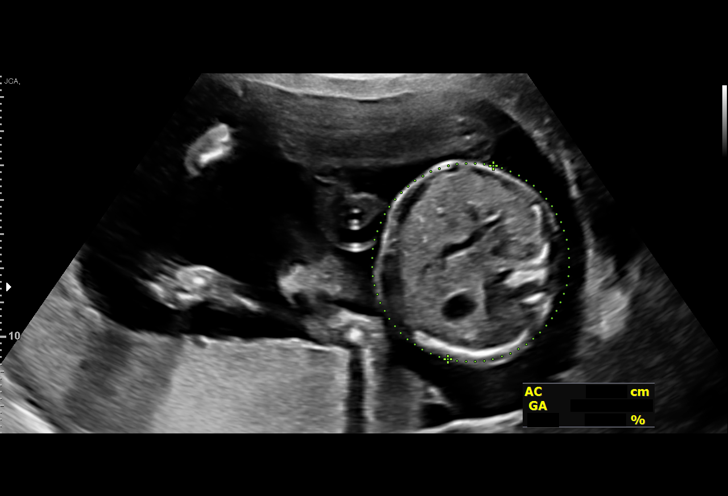
[im 36/70]
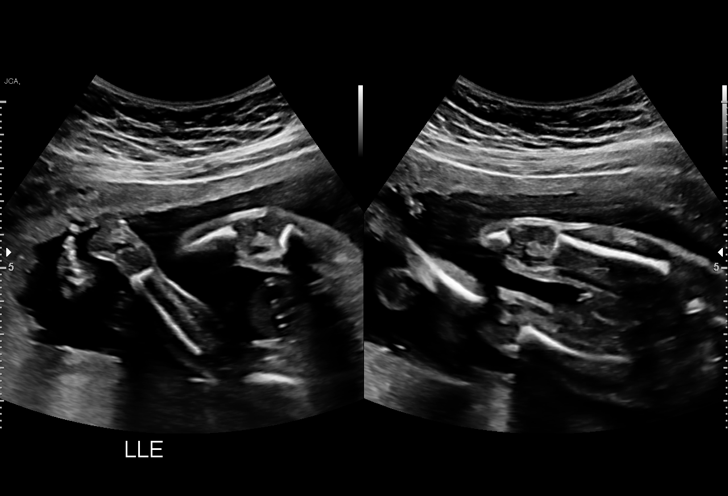
[im 41/70]
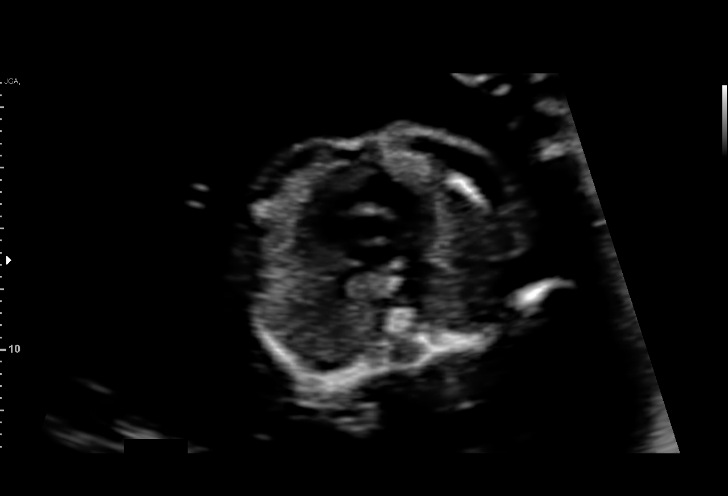
[im 47/70]
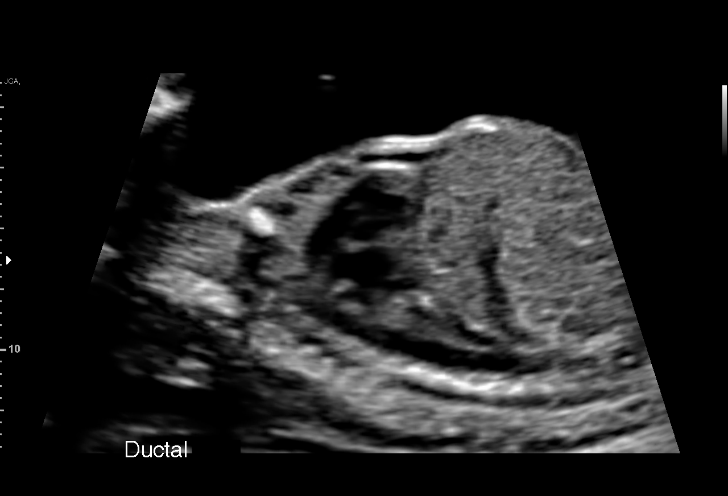
[im 52/70]
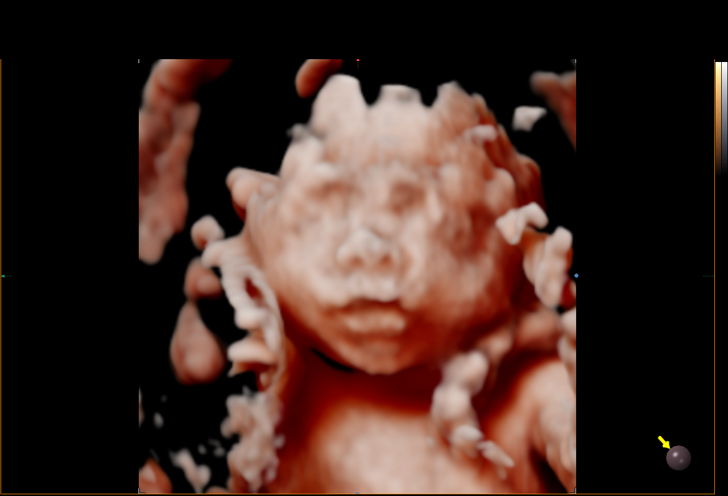
[im 57/70]
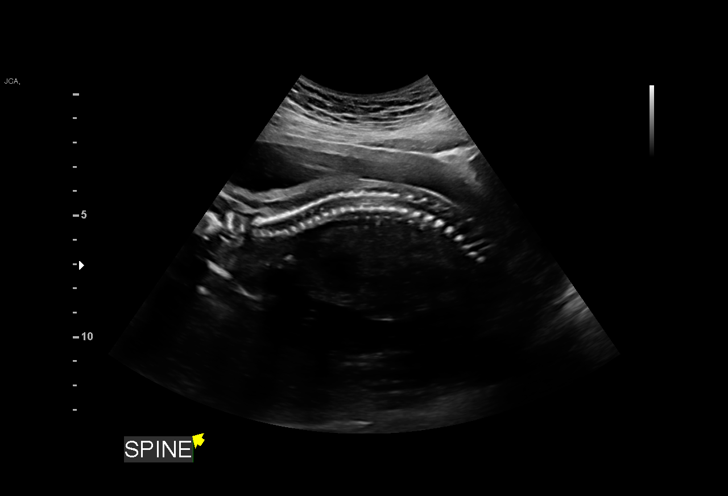
[im 62/70]
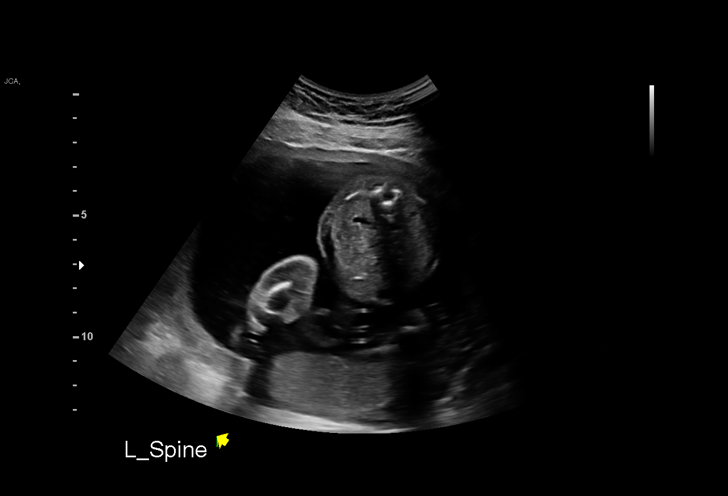
[im 67/70]
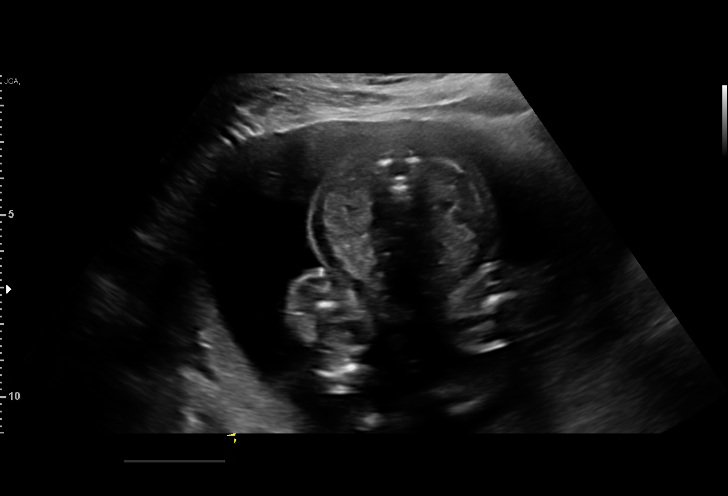

[13 of 28 positions shown; findings below may reference images not displayed]

Attending:        Dharma Wendling        Secondary Phy.:   [REDACTED]
                                                            Healthcare
                   [id number]

Indications

 Uterine fibroids affecting pregnancy in        O34.12,
 second trimester, antepartum
 Encounter for antenatal screening for
 malformations
 22 weeks gestation of pregnancy
Fetal Evaluation

 Num Of Fetuses:         1
 Fetal Heart Rate(bpm):  137
 Cardiac Activity:       Observed
 Presentation:           Breech
 Placenta:               Posterior
 P. Cord Insertion:      Visualized

 Amniotic Fluid
 AFI FV:      Within normal limits

                             Largest Pocket(cm)

Biometry

 BPD:      53.6  mm     G. Age:  22w 2d         53  %    CI:        74.57   %    70 - 86
                                                         FL/HC:      20.1   %    18.4 -
 HC:       197   mm     G. Age:  21w 6d         28  %    HC/AC:      1.06        1.06 -
 AC:      185.6  mm     G. Age:  23w 2d         79  %    FL/BPD:     73.7   %    71 - 87
 FL:       39.5  mm     G. Age:  22w 5d         59  %    FL/AC:      21.3   %    20 - 24
 HUM:      33.3  mm     G. Age:  21w 2d         27  %
 CER:      25.3  mm     G. Age:  23w 0d         90  %
 NFT:       5.4  mm
 LV:        7.3  mm
 CM:        3.6  mm

 Est. FW:     547  gm      1 lb 3 oz     82  %
OB History

 Gravidity:    3         Term:   0        Prem:   0        SAB:   0
 TOP:          2       Ectopic:  0        Living: 0
Gestational Age

 LMP:           22w 1d        Date:  07/28/20                 EDD:   05/04/21
 U/S Today:     22w 4d                                        EDD:   05/01/21
 Best:          22w 1d     Det. By:  LMP  (07/28/20)          EDD:   05/04/21
Anatomy

 Cranium:               Appears normal         LVOT:                   Appears normal
 Cavum:                 Appears normal         Aortic Arch:            Appears normal
 Ventricles:            Appears normal         Ductal Arch:            Appears normal
 Choroid Plexus:        Appears normal         Diaphragm:              Appears normal
 Cerebellum:            Appears normal         Stomach:                Appears normal, left
                                                                       sided
 Posterior Fossa:       Appears normal         Abdomen:                Appears normal
 Nuchal Fold:           Not applicable (>20    Abdominal Wall:         Appears nml (cord
                        wks GA)                                        insert, abd wall)
 Face:                  Appears normal         Cord Vessels:           Appears normal (3
                        (orbits and profile)                           vessel cord)
 Lips:                  Appears normal         Kidneys:                Appear normal
                        (3D)
 Palate:                Not well visualized    Bladder:                Appears normal
 Thoracic:              Appears normal         Spine:                  Appears normal
 Heart:                 Appears normal         Upper Extremities:      Appears normal
                        (4CH, axis, and
                        situs)
 RVOT:                  Appears normal         Lower Extremities:      Appears normal

 Other:  Nasal bone visualized. Fetus appears to be a male. Technically
         difficult due to fetal position.
Cervix Uterus Adnexa

 Cervix
 Length:            3.1  cm.
 Normal appearance by transabdominal scan.

 Uterus
 Single fibroid noted, see table below.

 Right Ovary
 Not visualized.
 Left Ovary
 Not visualized.

 Cul De Sac
 No free fluid seen.

 Adnexa
 No abnormality visualized. No adnexal mass
 visualized. No free fluid.
Myomas

 Site                     L(cm)      W(cm)      D(cm)       Location
 Anterior Fundal          5.4        3.3        4.5         Intramural

 Blood Flow                  RI       PI       Comments

Impression

 G3 P0. Patient is here for fetal anatomy scan. On cell-free
 fetal DNA screening, the risks of aneuploidies are not
 increased.
 We performed a fetal anatomy scan. No markers of
 aneuploidies or fetal structural defects are seen. Fetal
 biometry is consistent with her previously-established dates.
 Amniotic fluid is normal and good fetal activity is seen.
 Patient understands the limitations of ultrasound in detecting
 fetal anomalies.

 A posterior intramural myoma is seen (measurements above).
Recommendations

 -An appointment was made for her to return in 8 weeks for
 fetal growth assessment.
                 ROHULLAHAMIN GOLZAD

## 2021-11-15 ENCOUNTER — Ambulatory Visit (INDEPENDENT_AMBULATORY_CARE_PROVIDER_SITE_OTHER): Payer: Medicaid Other

## 2021-11-15 VITALS — BP 113/68 | HR 85 | Wt 165.0 lb

## 2021-11-15 DIAGNOSIS — Z23 Encounter for immunization: Secondary | ICD-10-CM | POA: Diagnosis not present

## 2021-11-15 NOTE — Progress Notes (Signed)
Pt here today for 3rd dose Gardasil.  Administered IM in left deltoid.   Pt tolerated well.  Pt advised to call the office in August to schedule her annual appt for September.  Pt verbalized understanding with no further questions.   ? Mel Almond, RN  ?11/15/21 ?

## 2021-12-19 ENCOUNTER — Ambulatory Visit: Payer: Medicaid Other | Admitting: Nurse Practitioner

## 2021-12-27 ENCOUNTER — Inpatient Hospital Stay (HOSPITAL_COMMUNITY): Payer: Medicaid Other

## 2021-12-27 ENCOUNTER — Encounter (HOSPITAL_COMMUNITY): Payer: Self-pay | Admitting: Obstetrics and Gynecology

## 2021-12-27 ENCOUNTER — Inpatient Hospital Stay (HOSPITAL_COMMUNITY)
Admission: AD | Admit: 2021-12-27 | Discharge: 2021-12-27 | Disposition: A | Discharge status: Home / Self Care | Payer: Medicaid Other | Attending: Obstetrics and Gynecology | Admitting: Obstetrics and Gynecology

## 2021-12-27 DIAGNOSIS — R109 Unspecified abdominal pain: Secondary | ICD-10-CM

## 2021-12-27 DIAGNOSIS — Z3A01 Less than 8 weeks gestation of pregnancy: Secondary | ICD-10-CM | POA: Diagnosis not present

## 2021-12-27 DIAGNOSIS — O26851 Spotting complicating pregnancy, first trimester: Secondary | ICD-10-CM | POA: Insufficient documentation

## 2021-12-27 DIAGNOSIS — O26891 Other specified pregnancy related conditions, first trimester: Secondary | ICD-10-CM | POA: Diagnosis present

## 2021-12-27 DIAGNOSIS — R102 Pelvic and perineal pain: Secondary | ICD-10-CM | POA: Insufficient documentation

## 2021-12-27 LAB — URINALYSIS, ROUTINE W REFLEX MICROSCOPIC
Bilirubin Urine: NEGATIVE
Glucose, UA: NEGATIVE mg/dL
Ketones, ur: 20 mg/dL — AB
Nitrite: NEGATIVE
Protein, ur: NEGATIVE mg/dL
Specific Gravity, Urine: 1.015 (ref 1.005–1.030)
pH: 6 (ref 5.0–8.0)

## 2021-12-27 LAB — CBC WITH DIFFERENTIAL/PLATELET
Abs Immature Granulocytes: 0.04 10*3/uL (ref 0.00–0.07)
Basophils Absolute: 0.1 10*3/uL (ref 0.0–0.1)
Basophils Relative: 1 %
Eosinophils Absolute: 0.1 10*3/uL (ref 0.0–0.5)
Eosinophils Relative: 0 %
HCT: 40.2 % (ref 36.0–46.0)
Hemoglobin: 13.6 g/dL (ref 12.0–15.0)
Immature Granulocytes: 0 %
Lymphocytes Relative: 21 %
Lymphs Abs: 2.6 10*3/uL (ref 0.7–4.0)
MCH: 28.6 pg (ref 26.0–34.0)
MCHC: 33.8 g/dL (ref 30.0–36.0)
MCV: 84.6 fL (ref 80.0–100.0)
Monocytes Absolute: 0.9 10*3/uL (ref 0.1–1.0)
Monocytes Relative: 7 %
Neutro Abs: 8.8 10*3/uL — ABNORMAL HIGH (ref 1.7–7.7)
Neutrophils Relative %: 71 %
Platelets: 311 10*3/uL (ref 150–400)
RBC: 4.75 MIL/uL (ref 3.87–5.11)
RDW: 14.8 % (ref 11.5–15.5)
WBC: 12.5 10*3/uL — ABNORMAL HIGH (ref 4.0–10.5)
nRBC: 0 % (ref 0.0–0.2)

## 2021-12-27 LAB — WET PREP, GENITAL
Clue Cells Wet Prep HPF POC: NONE SEEN
Sperm: NONE SEEN
Trich, Wet Prep: NONE SEEN
WBC, Wet Prep HPF POC: >=10 — AB (ref <10)
Yeast Wet Prep HPF POC: NONE SEEN

## 2021-12-27 LAB — ABO/RH: ABO/RH(D): O POS

## 2021-12-27 LAB — COMPREHENSIVE METABOLIC PANEL
ALT: 39 U/L (ref 0–44)
AST: 31 U/L (ref 15–41)
Albumin: 4.1 g/dL (ref 3.5–5.0)
Alkaline Phosphatase: 56 U/L (ref 38–126)
Anion gap: 10 (ref 5–15)
BUN: <5 mg/dL — ABNORMAL LOW (ref 6–20)
CO2: 22 mmol/L (ref 22–32)
Calcium: 9.2 mg/dL (ref 8.9–10.3)
Chloride: 103 mmol/L (ref 98–111)
Creatinine, Ser: 0.74 mg/dL (ref 0.44–1.00)
GFR, Estimated: >60 mL/min (ref >60)
Glucose, Bld: 98 mg/dL (ref 70–99)
Potassium: 3.6 mmol/L (ref 3.5–5.1)
Sodium: 135 mmol/L (ref 135–145)
Total Bilirubin: 0.7 mg/dL (ref 0.3–1.2)
Total Protein: 7.8 g/dL (ref 6.5–8.1)

## 2021-12-27 LAB — HCG, QUANTITATIVE, PREGNANCY: hCG, Beta Chain, Quant, S: 71329 m[IU]/mL — ABNORMAL HIGH (ref <5)

## 2021-12-27 LAB — PREGNANCY, URINE: Preg Test, Ur: POSITIVE — AB

## 2021-12-27 LAB — POCT PREGNANCY, URINE: Preg Test, Ur: POSITIVE — AB

## 2021-12-27 NOTE — MAU Provider Note (Signed)
History     CSN: [id number]  Arrival date and time: 12/27/21 1342   Event Date/Time   First Provider Initiated Contact with Patient 12/27/21 1541      No chief complaint on file.  HPI  Ms.Heather Brock Is a 31 y.o. female [phone] @ Unknown here with lower abdominal cramping and one episode of spotting this morning. The pain comes and goes. She has not taken anything for the pain. The pain does not radiate. She is taking prenatal vitamins daily. The spotting is very light. Based on how the discharge/blood looked, she is concerned she had a miscarriage.   OB History     Gravida  4   Para  1   Term  1   Preterm  0   AB  2   Living  1      SAB  0   IAB  2   Ectopic  0   Multiple  0   Live Births  1           Past Medical History:  Diagnosis Date   Chlamydia    Depression    when younger, ok now   Fibroid    Gonorrhea    Medical history non-contributory    Trichomonas exposure     Past Surgical History:  Procedure Laterality Date   NO PAST SURGERIES     THERAPEUTIC ABORTION      Family History  Problem Relation Age of Onset   Hyperlipidemia Father    Hypertension Father     Social History   Tobacco Use   Smoking status: Never   Smokeless tobacco: Never   Tobacco comments:    2 packs /day- quit 2015  Vaping Use   Vaping Use: Never used  Substance Use Topics   Alcohol use: Never   Drug use: Not Currently    Types: Marijuana    Comment: stopped in Jan 2022    Allergies:  Allergies  Allergen Reactions   Latex     Irritation with condoms    Medications Prior to Admission  Medication Sig Dispense Refill Last Dose   acetaminophen (TYLENOL) 325 MG tablet Take 2 tablets (650 mg total) by mouth every 4 (four) hours as needed (for pain scale < 4). (Patient not taking: Reported on 05/15/2021)      ferrous sulfate 325 (65 FE) MG tablet Take 1 tablet (325 mg total) by mouth every other day. (Patient not taking: Reported on 11/15/2021) 30 tablet 3     ibuprofen (ADVIL) 200 MG tablet Take 3 tablets (600 mg total) by mouth every 6 (six) hours. (Patient not taking: Reported on 05/15/2021)      Prenatal Vit-Fe Fumarate-FA (PRENATAL VITAMINS) 28-0.8 MG TABS Take 1 tablet by mouth daily. 30 tablet 6    Results for orders placed or performed during the hospital encounter of 12/27/21 (from the past 48 hour(s))  Pregnancy, urine POC     Status: Abnormal   Collection Time: 12/27/21  2:59 PM  Result Value Ref Range   Preg Test, Ur POSITIVE (A) NEGATIVE    Comment:        THE SENSITIVITY OF THIS METHODOLOGY IS >24 mIU/mL   Pregnancy, urine     Status: Abnormal   Collection Time: 12/27/21  3:02 PM  Result Value Ref Range   Preg Test, Ur POSITIVE (A) NEGATIVE    Comment:        THE SENSITIVITY OF THIS METHODOLOGY IS >20 mIU/mL. Performed at [facility], [place] [address], [place], [place] [address]   Urinalysis, Routine w reflex microscopic Urine, Clean Catch     Status: Abnormal   Collection Time: 12/27/21  3:05 PM  Result Value Ref Range   Color, Urine YELLOW YELLOW   APPearance HAZY (A) CLEAR   Specific Gravity, Urine 1.015 1.005 - 1.030   pH 6.0 5.0 - 8.0   Glucose, UA NEGATIVE NEGATIVE mg/dL   Hgb urine dipstick MODERATE (A) NEGATIVE   Bilirubin Urine NEGATIVE NEGATIVE   Ketones, ur 20 (A) NEGATIVE mg/dL   Protein, ur NEGATIVE NEGATIVE mg/dL   Nitrite NEGATIVE NEGATIVE   Leukocytes,Ua SMALL (A) NEGATIVE   RBC / HPF 0-5 0 - 5 RBC/hpf   WBC, UA 11-20 0 - 5 WBC/hpf   Bacteria, UA FEW (A) NONE SEEN   Squamous Epithelial / LPF 0-5 0 - 5   Mucus PRESENT     Comment: Performed at [facility], [address] [place], [place] [address]  CBC with Differential/Platelet     Status: Abnormal   Collection Time: 12/27/21  3:17 PM  Result Value Ref Range   WBC 12.5 (H) 4.0 - 10.5 K/uL   RBC 4.75 3.87 - 5.11 MIL/uL   Hemoglobin 13.6 12.0 - 15.0 g/dL   HCT 40.2 36.0 - 46.0 %   MCV 84.6 80.0 - 100.0 fL   MCH 28.6 26.0 - 34.0  pg   MCHC 33.8 30.0 - 36.0 g/dL   RDW 14.8 11.5 - 15.5 %   Platelets 311 150 - 400 K/uL   nRBC 0.0 0.0 - 0.2 %   Neutrophils Relative % 71 %   Neutro Abs 8.8 (H) 1.7 - 7.7 K/uL   Lymphocytes Relative 21 %   Lymphs Abs 2.6 0.7 - 4.0 K/uL   Monocytes Relative 7 %   Monocytes Absolute 0.9 0.1 - 1.0 K/uL   Eosinophils Relative 0 %   Eosinophils Absolute 0.1 0.0 - 0.5 K/uL   Basophils Relative 1 %   Basophils Absolute 0.1 0.0 - 0.1 K/uL   Immature Granulocytes 0 %   Abs Immature Granulocytes 0.04 0.00 - 0.07 K/uL    Comment: Performed at [facility] [address], [place], [place] [address]  Comprehensive metabolic panel     Status: Abnormal   Collection Time: 12/27/21  3:17 PM  Result Value Ref Range   Sodium 135 135 - 145 mmol/L   Potassium 3.6 3.5 - 5.1 mmol/L   Chloride 103 98 - 111 mmol/L   CO2 22 22 - 32 mmol/L   Glucose, Bld 98 70 - 99 mg/dL    Comment: Glucose reference range applies only to samples taken after fasting for at least 8 hours.   BUN <5 (L) 6 - 20 mg/dL   Creatinine, Ser 0.74 0.44 - 1.00 mg/dL   Calcium 9.2 8.9 - 10.3 mg/dL   Total Protein 7.8 6.5 - 8.1 g/dL   Albumin 4.1 3.5 - 5.0 g/dL   AST 31 15 - 41 U/L   ALT 39 0 - 44 U/L   Alkaline Phosphatase 56 38 - 126 U/L   Total Bilirubin 0.7 0.3 - 1.2 mg/dL   GFR, Estimated >60 >60 mL/min    Comment: (NOTE) Calculated using the CKD-EPI Creatinine Equation (2021)    Anion gap 10 5 - 15    Comment: Performed at [facility] [address], [place], [place] [address]  ABO/Rh     Status:  None   Collection Time: 12/27/21  3:17 PM  Result Value Ref Range   ABO/RH(D) O POS    No rh immune globuloin      NOT A RH IMMUNE GLOBULIN CANDIDATE, PT RH POSITIVE Performed at [facility] [address] [place], [place] [address]   hCG, quantitative, pregnancy     Status: Abnormal   Collection Time: 12/27/21  3:17 PM  Result Value Ref Range   hCG, Beta Chain, Quant, S 71,329 (H) <5 mIU/mL     Comment:          GEST. AGE      CONC.  (mIU/mL)   <=1 WEEK        5 - 50     2 WEEKS       50 - 500     3 WEEKS       100 - 10,000     4 WEEKS     1,000 - 30,000     5 WEEKS     3,500 - 115,000   6-8 WEEKS     12,000 - 270,000    12 WEEKS     15,000 - 220,000        FEMALE AND NON-PREGNANT FEMALE:     LESS THAN 5 mIU/mL Performed at [facility], [place] [address] [place], [place] [address]   Wet prep, genital     Status: Abnormal   Collection Time: 12/27/21  3:25 PM   Specimen: PATH Cytology Cervicovaginal Ancillary Only  Result Value Ref Range   Yeast Wet Prep HPF POC NONE SEEN NONE SEEN   Trich, Wet Prep NONE SEEN NONE SEEN   Clue Cells Wet Prep HPF POC NONE SEEN NONE SEEN   WBC, Wet Prep HPF POC >=10 (A) <10   Sperm NONE SEEN     Comment: Performed at [facility], [place] [address] [place], [place] [address]     US OB LESS THAN 14 WEEKS WITH OB TRANSVAGINAL  Result Date: 12/27/2021 CLINICAL DATA:  Pelvic pain, vaginal bleeding EXAM: OBSTETRIC <14 WK [place] AND TRANSVAGINAL OB US TECHNIQUE: Both transabdominal and transvaginal ultrasound examinations were performed for complete evaluation of the gestation as well as the maternal uterus, adnexal regions, and pelvic cul-de-sac. Transvaginal technique was performed to assess early pregnancy. COMPARISON:  None Available. FINDINGS: Intrauterine gestational sac: Single Yolk sac:  Seen Embryo:  Seen Cardiac Activity: Seen Heart Rate: 127 bpm CRL:  6.2 mm   6 w   3 d                  [place] EDC: 08/19/2022 Subchorionic hemorrhage:  None visualized. Maternal uterus/adnexae: Uterine cervix is closed. There is 1.7 cm hypoechoic structure in the right adnexa, possibly hemorrhagic cyst/follicle. There is no free fluid in the pelvis. IMPRESSION: Single live intrauterine pregnancy seen. Sonographically estimated gestational age is 6 weeks 3 days. Electronically Signed   By: Elmer Picker M.D.   On: 12/27/2021 16:36     Review of Systems   Constitutional:  Negative for fever.  Gastrointestinal:  Positive for abdominal pain.  Genitourinary:  Positive for vaginal bleeding. Negative for vaginal discharge.  Physical Exam   Blood pressure 114/83, pulse 98, resp. rate 18, height '5\' 2"'$  (1.575 m), weight 74.4 kg, last menstrual period 11/20/2021, not currently breastfeeding.  Physical Exam Constitutional:      Appearance: She is well-developed.  Eyes:     Extraocular Movements: Extraocular movements  intact.  Abdominal:     Tenderness: There is no abdominal tenderness.  Skin:    General: Skin is warm.  Neurological:     Mental Status: She is alert and oriented to person, place, and time.   MAU Course  Procedures  MDM  O positive blood type  Wet prep & GC CBC, Hcg, ABO US OB transvaginal    Assessment and Plan   A:  1. Abdominal pain during pregnancy in first trimester   2. [redacted] weeks gestation of pregnancy      P:  Dc home Return to MAU if symptoms worsen Start prenatal care- list of providers given Prenatal vitamins daily.   Lezlie Lye, NP 12/27/2021 6:33 PM

## 2021-12-27 NOTE — Discharge Instructions (Signed)
[facility]   [facility]             [address], [place], [place] [address] [phone]  [facility]                                                             [address], [place], [place], [address] [phone]  [facility]                                    [address], [place], [place], [address] [phone]  [facility] [place] [address], [place], [place], [address] [phone]  [facility]                                 [address], [place], [place], [address] [phone]  [facility]                                    [address], [place], [place], [address] [phone]  [facility] [address], [place], [place], [address]                              [facility] [address], [place], [place], [address] [phone]  [facility]         Phone: [phone]  [facility]      Phone: [phone]   [facility]      Phone: [phone]  [facility]         Phone: [phone]   [facility]    Phone: [phone]  [facility]      Phone: [phone]  [facility]     Phone: [phone]  [facility]        Phone: [phone]  [facility]      Phone: [phone]  

## 2021-12-27 NOTE — MAU Note (Signed)
Pt presents c/o some cramping and vaginal bleeding.  LMP 11/20/2021. Had a positive pregnancy test on 12/07/2021.  Pt went to bathroom for urine sample and had some thick mucous with scant bleeding noted.  Pts states the cramps feel like a period
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
## 2021-12-28 LAB — CULTURE, OB URINE: Culture: >=100000 — AB

## 2021-12-28 LAB — GC/CHLAMYDIA PROBE AMP (~~LOC~~) NOT AT ARMC
Chlamydia: NEGATIVE
Comment: NEGATIVE
Comment: NORMAL
Neisseria Gonorrhea: NEGATIVE

## 2022-02-14 ENCOUNTER — Telehealth: Payer: Medicaid Other

## 2022-02-21 ENCOUNTER — Telehealth (INDEPENDENT_AMBULATORY_CARE_PROVIDER_SITE_OTHER): Payer: Medicaid Other

## 2022-02-21 DIAGNOSIS — Z3A00 Weeks of gestation of pregnancy not specified: Secondary | ICD-10-CM

## 2022-02-21 DIAGNOSIS — Z3480 Encounter for supervision of other normal pregnancy, unspecified trimester: Secondary | ICD-10-CM

## 2022-02-21 DIAGNOSIS — O09899 Supervision of other high risk pregnancies, unspecified trimester: Secondary | ICD-10-CM | POA: Insufficient documentation

## 2022-02-21 DIAGNOSIS — Z3490 Encounter for supervision of normal pregnancy, unspecified, unspecified trimester: Secondary | ICD-10-CM | POA: Insufficient documentation

## 2022-02-21 DIAGNOSIS — Z5941 Food insecurity: Secondary | ICD-10-CM

## 2022-02-21 MED ORDER — BLOOD PRESSURE CUFF MISC: 1.0000 | 0 refills | Status: DC | PRN

## 2022-02-21 NOTE — Progress Notes (Signed)
New OB Intake  I connected with  Heather Brock on 02/21/22 at 10:15 AM EDT by MyChart Video Visit and verified that I am speaking with the correct person using two identifiers. Nurse is located at [facility] and pt is located at home.  I discussed the limitations, risks, security and privacy concerns of performing an evaluation and management service by telephone and the availability of in person appointments. I also discussed with the patient that there may be a patient responsible charge related to this service. The patient expressed understanding and agreed to proceed.  I explained I am completing New OB Intake today. We discussed her EDD of 08/27/2022 that is based on LMP of 11/20/21. Pt is G4/P1. I reviewed her allergies, medications, Medical/Surgical/OB history, and appropriate screenings. I informed her of [facility] services. [facility] information placed in AVS. Based on history, this is a/an  pregnancy uncomplicated .   Patient Active Problem List   Diagnosis Date Noted   LGSIL on Pap smear of cervix 02/27/2021    Concerns addressed today  Delivery Plans Plans to deliver at [facility]. Patient given information for [facility] Healthy Baby website for more information about Women's and [place]. Patient is not interested in water birth. Offered upcoming OB visit with CNM to discuss further.  MyChart/Babyscripts MyChart access verified. I explained pt will have some visits in office and some virtually. Babyscripts instructions given and order placed. Patient verifies receipt of registration text/e-mail. Account successfully created and app downloaded.  Blood Pressure Cuff/Weight Scale Blood pressure cuff ordered for patient to pick-up from [employer]. Explained after first prenatal appt pt will check weekly and document in 56. Patient does / does not  have weight scale. Weight scale ordered for patient to pick up from [employer].   Anatomy US Explained first scheduled [place] will be around  19 weeks. Anatomy US scheduled for August 28th at 1245.   Labs Discussed Johnsie Cancel genetic screening with patient. Would like Panorama drawn at new OB visit. Routine prenatal labs needed.  Covid Vaccine Patient has not covid vaccine.   Is patient a CenteringPregnancy candidate?  Declined Declined due to Group Setting   Is patient a Mom+Baby Combined Care candidate?  Not a candidate  due to patient being multipara.    Social Determinants of Health Food Insecurity: Patient expresses food insecurity. Food Market information given to patient; explained patient may visit at the end of first OB appointment. WIC Referral: Patient is interested in referral to [facility].  Transportation: Patient denies transportation needs. Childcare: Discussed no children allowed at ultrasound appointments. Offered childcare services; patient declines childcare services at this time.  First visit review I reviewed new OB appt with pt. I explained she will have a provider visit that includes no pap smear as her last pap smear was on 11/17/20 . Explained pt will be seen by Heather Ebbs, NP at first visit; encounter routed to appropriate provider. Explained that patient will be seen by pregnancy navigator following visit with provider.   Heather Carmine, RN 02/21/2022  10:19 AM

## 2022-02-21 NOTE — Patient Instructions (Signed)

## 2022-03-19 ENCOUNTER — Ambulatory Visit (INDEPENDENT_AMBULATORY_CARE_PROVIDER_SITE_OTHER): Payer: Medicaid Other | Admitting: Student

## 2022-03-19 ENCOUNTER — Other Ambulatory Visit (HOSPITAL_COMMUNITY)
Admission: RE | Admit: 2022-03-19 | Discharge: 2022-03-19 | Disposition: A | Discharge status: Home / Self Care | Payer: Medicaid Other | Source: Ambulatory Visit | Attending: Student | Admitting: Student

## 2022-03-19 ENCOUNTER — Other Ambulatory Visit: Payer: Self-pay

## 2022-03-19 VITALS — BP 110/74 | HR 100 | Wt 168.0 lb

## 2022-03-19 DIAGNOSIS — Z3A17 17 weeks gestation of pregnancy: Secondary | ICD-10-CM

## 2022-03-19 DIAGNOSIS — Z3492 Encounter for supervision of normal pregnancy, unspecified, second trimester: Secondary | ICD-10-CM

## 2022-03-19 DIAGNOSIS — N898 Other specified noninflammatory disorders of vagina: Secondary | ICD-10-CM | POA: Diagnosis present

## 2022-03-19 DIAGNOSIS — Z3490 Encounter for supervision of normal pregnancy, unspecified, unspecified trimester: Secondary | ICD-10-CM

## 2022-03-19 NOTE — Progress Notes (Signed)
History:   Heather Brock is a 31 y.o. [id number] at 37w0dby LMP being seen today for her first obstetrical visit.  Her obstetrical history is significant for  Short interval pregnancy . Patient does intend to breast feed. Pregnancy history fully reviewed.  Patient reports no complaints.      HISTORY: OB History  Gravida Para Term Preterm AB Living  '4 1 1 '$ 0 2 1  SAB IAB Ectopic Multiple Live Births  0 2 0 0 1    # Outcome Date GA Lbr Len/2nd Weight Sex Delivery Anes PTL Lv  4 Current           3 Term 04/18/21 [id number]:15 / 08:05 8 lb 0.2 oz (3.635 kg) M Vag-Spont None  LIV     Birth Comments: WNL     Name: Nabor,BOY Ragen     Apgar1: 8  Apgar5: 9  2 IAB      TAB     1 IAB      TAB       Last pap smear was done 2022 and was abnormal - LSIL + HRHPV . Colpo done 05/2021 with Low-grade squamous intraepithelial lesion, CIN-1 (low grade  dysplasia).  Past Medical History:  Diagnosis Date   Chlamydia    Depression    when younger, ok now   Fibroid    Gonorrhea    Trichomonas exposure    Vaginal Pap smear, abnormal    Past Surgical History:  Procedure Laterality Date   NO PAST SURGERIES     THERAPEUTIC ABORTION     WISDOM TOOTH EXTRACTION     Family History  Problem Relation Age of Onset   Diabetes Father    Hyperlipidemia Father    Hypertension Father    Kidney failure Father    Social History   Tobacco Use   Smoking status: Never   Smokeless tobacco: Never   Tobacco comments:    2 packs /day- quit 2015  Vaping Use   Vaping Use: Never used  Substance Use Topics   Alcohol use: Never   Drug use: Not Currently    Types: Marijuana    Comment: stopped in Jan 2022   Allergies  Allergen Reactions   Latex     Irritation with condoms   Current Outpatient Medications on File Prior to Visit  Medication Sig Dispense Refill   Blood Pressure Monitoring (BLOOD PRESSURE CUFF) MISC 1 Device by Does not apply route as needed. 1 each 0   Prenatal Vit-Fe Fumarate-FA (PRENATAL  VITAMINS) 28-0.8 MG TABS Take 1 tablet by mouth daily. 30 tablet 6   No current facility-administered medications on file prior to visit.    Review of Systems Pertinent items noted in HPI and remainder of comprehensive ROS otherwise negative. Physical Exam:   Vitals:   03/19/22 1038  BP: 110/74  Pulse: 100  Weight: 168 lb (76.2 kg)   Fetal Heart Rate (bpm): 148 Uterus:     Pelvic Exam: Unable to perform at today's visit                       System: General: well-developed, well-nourished female in no acute distress   Breasts:  normal appearance, no masses or tenderness bilaterally   Skin: normal coloration and turgor, no rashes   Neurologic: oriented, normal, negative, normal mood   Extremities: normal strength, tone, and muscle mass, ROM of all joints is normal   HEENT PERRLA, extraocular movement intact and  sclera clear, anicteric   Mouth/Teeth mucous membranes moist, pharynx normal without lesions and dental hygiene good   Neck supple and no masses   Cardiovascular: regular rate and rhythm   Respiratory:  no respiratory distress, normal breath sounds   Abdomen: soft, non-tender; bowel sounds normal; no masses,  no organomegaly     Assessment:    Pregnancy: [id number] Patient Active Problem List   Diagnosis Date Noted   Supervision of low-risk pregnancy 02/21/2022   Short interval between pregnancies affecting pregnancy, antepartum 02/21/2022   LGSIL on Pap smear of cervix 02/27/2021     Plan:    1. Encounter for supervision of low-risk pregnancy, antepartum - Feeling well today, excited about pregnancy - Will need pap collected postpartum - Panorama Prenatal Test Full Panel - Culture, OB Urine - Hemoglobin A1c - CBC/D/Plt+RPR+Rh+ABO+RubIgG... - AFP, Serum, Open Spina Bifida  2. [redacted] weeks gestation of pregnancy - Anatomy scan ordered  3. Vaginal discharge - Cervicovaginal ancillary only( [place])   Initial labs drawn. Continue prenatal  vitamins. Genetic Screening discussed,  Panorama and AFP : ordered. Ultrasound discussed; fetal anatomic survey:  scheduled . Problem list reviewed and updated. The nature of [facility] with multiple MDs and other Advanced Practice Providers was explained to patient; also emphasized that residents, students are part of our team. Routine obstetric precautions reviewed. No follow-ups on file.'@PLANEND'$   Indications for ASA therapy (per uptodate) One of the following: Previous pregnancy with preeclampsia, especially early onset and with an adverse outcome No Multifetal gestation No Chronic hypertension No Type 1 or 2 diabetes mellitus No Chronic kidney disease No Autoimmune disease (antiphospholipid syndrome, systemic lupus erythematosus) No  Two or more of the following: Nulliparity No Obesity (body mass index >30 kg/m2) No Family history of preeclampsia in mother or sister No Age ?35 years No Sociodemographic characteristics (African American race, low socioeconomic level) Yes Personal risk factors (eg, previous pregnancy with low birth weight or small for gestational age infant, previous adverse pregnancy outcome [eg, stillbirth], interval >10 years between pregnancies) No  Indications for early GDM screening  First-degree relative with diabetes No BMI >30kg/m2 No Age > 35 No Previous birth of an infant weighing ?4000 g No Gestational diabetes mellitus in a previous pregnancy No Glycated hemoglobin ?5.7 percent (39 mmol/mol), impaired glucose tolerance, or impaired fasting glucose on previous testing No High-risk race/ethnicity (eg, African American, Latino, Native American, [place] American, Pacific Islander) Yes Previous stillbirth of unknown cause No Maternal birthweight > 9 lbs No History of cardiovascular disease No Hypertension or on therapy for hypertension No High-density lipoprotein cholesterol level <35 mg/dL (0.90 mmol/L) and/or a  triglyceride level >250 mg/dL (2.82 mmol/L) No Polycystic ovary syndrome No Physical inactivity No Other clinical condition associated with insulin resistance (eg, severe obesity, acanthosis nigricans) No Current use of glucocorticoids No   Early screening tests: A1C  Johnston Ebbs, NP    [facility]

## 2022-03-20 LAB — CERVICOVAGINAL ANCILLARY ONLY
Bacterial Vaginitis (gardnerella): POSITIVE — AB
Candida Glabrata: NEGATIVE
Candida Vaginitis: NEGATIVE
Chlamydia: NEGATIVE
Comment: NEGATIVE
Comment: NEGATIVE
Comment: NEGATIVE
Comment: NEGATIVE
Comment: NEGATIVE
Comment: NORMAL
Neisseria Gonorrhea: NEGATIVE
Trichomonas: NEGATIVE

## 2022-03-21 LAB — URINE CULTURE, OB REFLEX

## 2022-03-21 LAB — CULTURE, OB URINE

## 2022-03-23 LAB — CBC/D/PLT+RPR+RH+ABO+RUBIGG...
Antibody Screen: NEGATIVE
Basophils Absolute: 0 10*3/uL (ref 0.0–0.2)
Basos: 0 %
EOS (ABSOLUTE): 0.3 10*3/uL (ref 0.0–0.4)
Eos: 2 %
HCV Ab: NONREACTIVE
HIV Screen 4th Generation wRfx: NONREACTIVE
Hematocrit: 35.9 % (ref 34.0–46.6)
Hemoglobin: 11.7 g/dL (ref 11.1–15.9)
Hepatitis B Surface Ag: NEGATIVE
Immature Grans (Abs): 0.1 10*3/uL (ref 0.0–0.1)
Immature Granulocytes: 1 %
Lymphocytes Absolute: 2 10*3/uL (ref 0.7–3.1)
Lymphs: 18 %
MCH: 28.7 pg (ref 26.6–33.0)
MCHC: 32.6 g/dL (ref 31.5–35.7)
MCV: 88 fL (ref 79–97)
Monocytes Absolute: 0.8 10*3/uL (ref 0.1–0.9)
Monocytes: 8 %
Neutrophils Absolute: 8 10*3/uL — ABNORMAL HIGH (ref 1.4–7.0)
Neutrophils: 71 %
Platelets: 261 10*3/uL (ref 150–450)
RBC: 4.08 x10E6/uL (ref 3.77–5.28)
RDW: 14.5 % (ref 11.7–15.4)
RPR Ser Ql: NONREACTIVE
Rh Factor: POSITIVE
Rubella Antibodies, IGG: 2.68 index (ref >0.99)
WBC: 11.2 10*3/uL — ABNORMAL HIGH (ref 3.4–10.8)

## 2022-03-23 LAB — AFP, SERUM, OPEN SPINA BIFIDA
AFP MoM: 0.59
AFP Value: 21.6 ng/mL
Gest. Age on Collection Date: 17 weeks
Maternal Age At EDD: 32.8 yr
OSBR Risk 1 IN: 10000
Test Results:: NEGATIVE
Weight: 169 [lb_av]

## 2022-03-23 LAB — HEMOGLOBIN A1C
Est. average glucose Bld gHb Est-mCnc: 117 mg/dL
Hgb A1c MFr Bld: 5.7 % — ABNORMAL HIGH (ref 4.8–5.6)

## 2022-03-23 LAB — HCV INTERPRETATION

## 2022-03-24 LAB — PANORAMA PRENATAL TEST FULL PANEL:PANORAMA TEST PLUS 5 ADDITIONAL MICRODELETIONS: FETAL FRACTION: 8.9

## 2022-03-28 ENCOUNTER — Other Ambulatory Visit: Payer: Self-pay

## 2022-03-28 ENCOUNTER — Other Ambulatory Visit: Payer: Self-pay | Admitting: Student

## 2022-03-28 ENCOUNTER — Ambulatory Visit: Payer: Medicaid Other

## 2022-03-28 ENCOUNTER — Telehealth: Payer: Self-pay | Admitting: General Practice

## 2022-03-28 DIAGNOSIS — B9689 Other specified bacterial agents as the cause of diseases classified elsewhere: Secondary | ICD-10-CM

## 2022-03-28 DIAGNOSIS — R7303 Prediabetes: Secondary | ICD-10-CM

## 2022-03-28 DIAGNOSIS — Z3490 Encounter for supervision of normal pregnancy, unspecified, unspecified trimester: Secondary | ICD-10-CM

## 2022-03-28 DIAGNOSIS — O99012 Anemia complicating pregnancy, second trimester: Secondary | ICD-10-CM

## 2022-03-28 MED ORDER — METRONIDAZOLE 0.75 % VA GEL: 1.0000 | Freq: Two times a day (BID) | VAGINAL | 0 refills | Status: DC

## 2022-03-28 MED ORDER — FERROUS GLUCONATE 324 (38 FE) MG PO TABS: 324.0000 mg | ORAL_TABLET | Freq: Every day | ORAL | 3 refills | Status: AC

## 2022-03-28 NOTE — Telephone Encounter (Signed)
Called patient to schedule diabetes education appt, no answer- left message to check mychart account for more information.

## 2022-04-02 ENCOUNTER — Ambulatory Visit: Payer: Medicaid Other | Admitting: *Deleted

## 2022-04-02 ENCOUNTER — Other Ambulatory Visit: Payer: Self-pay | Admitting: *Deleted

## 2022-04-02 ENCOUNTER — Ambulatory Visit: Payer: Medicaid Other | Attending: Student

## 2022-04-02 ENCOUNTER — Encounter: Payer: Self-pay | Admitting: *Deleted

## 2022-04-02 VITALS — BP 115/67 | HR 97

## 2022-04-02 DIAGNOSIS — O09892 Supervision of other high risk pregnancies, second trimester: Secondary | ICD-10-CM

## 2022-04-02 DIAGNOSIS — O3412 Maternal care for benign tumor of corpus uteri, second trimester: Secondary | ICD-10-CM | POA: Diagnosis not present

## 2022-04-02 DIAGNOSIS — Z3A20 20 weeks gestation of pregnancy: Secondary | ICD-10-CM | POA: Insufficient documentation

## 2022-04-02 DIAGNOSIS — Z3490 Encounter for supervision of normal pregnancy, unspecified, unspecified trimester: Secondary | ICD-10-CM | POA: Diagnosis present

## 2022-04-02 DIAGNOSIS — E669 Obesity, unspecified: Secondary | ICD-10-CM | POA: Diagnosis not present

## 2022-04-02 DIAGNOSIS — O99212 Obesity complicating pregnancy, second trimester: Secondary | ICD-10-CM | POA: Insufficient documentation

## 2022-04-02 DIAGNOSIS — D259 Leiomyoma of uterus, unspecified: Secondary | ICD-10-CM | POA: Insufficient documentation

## 2022-04-02 DIAGNOSIS — O09899 Supervision of other high risk pregnancies, unspecified trimester: Secondary | ICD-10-CM | POA: Insufficient documentation

## 2022-04-02 DIAGNOSIS — O3410 Maternal care for benign tumor of corpus uteri, unspecified trimester: Secondary | ICD-10-CM

## 2022-04-02 DIAGNOSIS — Z6829 Body mass index (BMI) 29.0-29.9, adult: Secondary | ICD-10-CM

## 2022-04-03 ENCOUNTER — Encounter: Payer: Self-pay | Admitting: Family Medicine

## 2022-04-10 ENCOUNTER — Other Ambulatory Visit: Payer: Medicaid Other

## 2022-04-16 ENCOUNTER — Encounter: Payer: Medicaid Other | Admitting: Family Medicine

## 2022-04-23 ENCOUNTER — Other Ambulatory Visit: Payer: Self-pay

## 2022-04-23 ENCOUNTER — Ambulatory Visit (INDEPENDENT_AMBULATORY_CARE_PROVIDER_SITE_OTHER): Payer: Medicaid Other | Admitting: Certified Nurse Midwife

## 2022-04-23 VITALS — BP 128/93 | HR 124 | Wt 181.0 lb

## 2022-04-23 DIAGNOSIS — R7309 Other abnormal glucose: Secondary | ICD-10-CM

## 2022-04-23 DIAGNOSIS — Z3492 Encounter for supervision of normal pregnancy, unspecified, second trimester: Secondary | ICD-10-CM

## 2022-04-23 DIAGNOSIS — Z3A23 23 weeks gestation of pregnancy: Secondary | ICD-10-CM

## 2022-04-24 NOTE — Progress Notes (Signed)
   PRENATAL VISIT NOTE  Subjective:  Heather Brock is a 31 y.o. [phone] at 72w2dbeing seen today for ongoing prenatal care.  She is currently monitored for the following issues for this low-risk pregnancy and has Fibroid uterus; LGSIL on Pap smear of cervix; Supervision of low-risk pregnancy; and Short interval between pregnancies affecting pregnancy, antepartum on their problem list.  Patient reports no complaints. She does express concerns for gestational diabetes and fibroids interfering with current pregnancy.  Contractions: Not present. Vag. Bleeding: None.  Movement: Present. Denies leaking of fluid.   The following portions of the patient's history were reviewed and updated as appropriate: allergies, current medications, past family history, past medical history, past social history, past surgical history and problem list.   Objective:   Vitals:   04/23/22 0934  BP: (!) 128/93  Pulse: (!) 124  Weight: 181 lb (82.1 kg)    Fetal Status: Fetal Heart Rate (bpm): 144   Movement: Present     General:  Alert, oriented and cooperative. Patient is in no acute distress.  Skin: Skin is warm and dry. No rash noted.   Cardiovascular: Normal heart rate noted  Respiratory: Normal respiratory effort, no problems with respiration noted  Abdomen: Soft, gravid, appropriate for gestational age.  Pain/Pressure: Present     Pelvic: Cervical exam deferred        Extremities: Normal range of motion.  Edema: None  Mental Status: Normal mood and affect. Normal behavior. Normal judgment and thought content.   Assessment and Plan:  Pregnancy: G4P1021 at 279w2d. Encounter for supervision of low-risk pregnancy in second trimester - Patient feeling frequent and vigorous fetal movement.   2. Elevated hemoglobin A1c - Discussed that patient had an elevated A1c in pregnancy. Reassured patient that this diagnosis is not definitive for GDM at this time.  - Recommended that patient speak with diabetic counselor  to discuss preventative measures that may decrease development of GDM. - Briefly reviewed food options and 10-20 minutes of walking that can assist with outcomes.  - Discussed GTT at next visit and fasting protocol for the night before.   3. [redacted] weeks gestation of pregnancy - Reviewed previous [place] patient, dicussed the normalcy identified within the results. Also discussed the fibroids that were identified and size of them. CNM reviewed the potential for growth and expectation of mild discomfort that can be associated with fibroids in pregnancy. Also discussed the commonality of fibroids in pregnancy and treatment may be offered post-pregnancy, if they become bothersome/worrisome.  - Patient reassured after discussion. Denies pain and discomfort from fibroids.  Preterm labor symptoms and general obstetric precautions including but not limited to vaginal bleeding, contractions, leaking of fluid and fetal movement were reviewed in detail with the patient. Please refer to After Visit Summary for other counseling recommendations.   Return in about 4 weeks (around 05/21/2022) for LOB w GTT.  Future Appointments  Date Time Provider [place]9/25/2023  1:15 PM [facility]/25/2023  1:30 PM [facility] US2 [facility]10/16/2023  9:30 AM [facility]OC[facility] LAB [facility]/16/2023 11:15 AM BaGriffin BasilMD [facility]ML[facility]  Charlize Hathaway (SIsaias SakaiPaRollene RotundaMSN, CNVera Cruzor [facility]  04/24/22 10:19 AM

## 2022-04-30 ENCOUNTER — Ambulatory Visit: Payer: Medicaid Other

## 2022-05-07 ENCOUNTER — Ambulatory Visit: Payer: Medicaid Other | Attending: Obstetrics

## 2022-05-07 ENCOUNTER — Ambulatory Visit: Payer: Medicaid Other | Admitting: *Deleted

## 2022-05-07 ENCOUNTER — Encounter: Payer: Self-pay | Admitting: *Deleted

## 2022-05-07 VITALS — BP 124/74 | HR 105

## 2022-05-07 DIAGNOSIS — O09899 Supervision of other high risk pregnancies, unspecified trimester: Secondary | ICD-10-CM

## 2022-05-07 DIAGNOSIS — Z6829 Body mass index (BMI) 29.0-29.9, adult: Secondary | ICD-10-CM | POA: Insufficient documentation

## 2022-05-07 DIAGNOSIS — O99212 Obesity complicating pregnancy, second trimester: Secondary | ICD-10-CM | POA: Insufficient documentation

## 2022-05-07 DIAGNOSIS — Z3492 Encounter for supervision of normal pregnancy, unspecified, second trimester: Secondary | ICD-10-CM

## 2022-05-07 DIAGNOSIS — O3410 Maternal care for benign tumor of corpus uteri, unspecified trimester: Secondary | ICD-10-CM | POA: Diagnosis not present

## 2022-05-07 DIAGNOSIS — O09892 Supervision of other high risk pregnancies, second trimester: Secondary | ICD-10-CM | POA: Insufficient documentation

## 2022-05-07 DIAGNOSIS — Z3A25 25 weeks gestation of pregnancy: Secondary | ICD-10-CM | POA: Diagnosis not present

## 2022-05-07 DIAGNOSIS — D259 Leiomyoma of uterus, unspecified: Secondary | ICD-10-CM | POA: Diagnosis not present

## 2022-05-08 ENCOUNTER — Other Ambulatory Visit: Payer: Self-pay | Admitting: *Deleted

## 2022-05-08 DIAGNOSIS — E6609 Other obesity due to excess calories: Secondary | ICD-10-CM

## 2022-05-16 ENCOUNTER — Other Ambulatory Visit: Payer: Self-pay | Admitting: General Practice

## 2022-05-16 DIAGNOSIS — Z3493 Encounter for supervision of normal pregnancy, unspecified, third trimester: Secondary | ICD-10-CM

## 2022-05-21 ENCOUNTER — Other Ambulatory Visit: Payer: Self-pay

## 2022-05-21 ENCOUNTER — Ambulatory Visit (INDEPENDENT_AMBULATORY_CARE_PROVIDER_SITE_OTHER): Payer: Medicaid Other | Admitting: Obstetrics and Gynecology

## 2022-05-21 ENCOUNTER — Other Ambulatory Visit: Payer: Medicaid Other

## 2022-05-21 VITALS — BP 115/74 | HR 93 | Wt 190.6 lb

## 2022-05-21 DIAGNOSIS — R87612 Low grade squamous intraepithelial lesion on cytologic smear of cervix (LGSIL): Secondary | ICD-10-CM

## 2022-05-21 DIAGNOSIS — Z3493 Encounter for supervision of normal pregnancy, unspecified, third trimester: Secondary | ICD-10-CM

## 2022-05-21 DIAGNOSIS — Z23 Encounter for immunization: Secondary | ICD-10-CM

## 2022-05-21 DIAGNOSIS — Z3A27 27 weeks gestation of pregnancy: Secondary | ICD-10-CM

## 2022-05-21 DIAGNOSIS — O09899 Supervision of other high risk pregnancies, unspecified trimester: Secondary | ICD-10-CM

## 2022-05-21 NOTE — Patient Instructions (Signed)
For colds and allergies  Any anti-histamine including benadryl, allegra, claritin, etc.  Sudafed but not phenylephrine  Mucinex  Robitussin  For Reflux/heartburn  Pepcid Zantac Tums Prilosec Prevacid  For yeast infections  Monistat  For constipation  Colace  For minor aches and pains  Tylenol-do not take more than 4074m in 24 hours. Therma-care or like heat packs

## 2022-05-21 NOTE — Progress Notes (Signed)
   PRENATAL VISIT NOTE  Subjective:  Heather Brock is a 31 y.o. [phone] at 52w1dbeing seen today for ongoing prenatal care.  She is currently monitored for the following issues for this low-risk pregnancy and has Fibroid uterus; LGSIL on Pap smear of cervix; Supervision of low-risk pregnancy; and Short interval between pregnancies affecting pregnancy, antepartum on their problem list.  Patient doing well with no acute concerns today. She reports no complaints.  Contractions: Not present. Vag. Bleeding: None.  Movement: Present. Denies leaking of fluid.   The following portions of the patient's history were reviewed and updated as appropriate: allergies, current medications, past family history, past medical history, past social history, past surgical history and problem list. Problem list updated.  Objective:   Vitals:   05/21/22 1202  BP: 115/74  Pulse: 93  Weight: 190 lb 9.6 oz (86.5 kg)    Fetal Status: Fetal Heart Rate (bpm): 144 Fundal Height: 27 cm Movement: Present     General:  Alert, oriented and cooperative. Patient is in no acute distress.  Skin: Skin is warm and dry. No rash noted.   Cardiovascular: Normal heart rate noted  Respiratory: Normal respiratory effort, no problems with respiration noted  Abdomen: Soft, gravid, appropriate for gestational age.  Pain/Pressure: Absent     Pelvic: Cervical exam deferred        Extremities: Normal range of motion.  Edema: None  Mental Status:  Normal mood and affect. Normal behavior. Normal judgment and thought content.   Assessment and Plan:  Pregnancy: G4P1021 at 244w1d1. [redacted] weeks gestation of pregnancy   2. LGSIL on Pap smear of cervix Repap after delivery  3. Encounter for supervision of low-risk pregnancy in third trimester Continue routine prenatal care 2 hour GTT in 1 week  - Tdap vaccine greater than or equal to 7yo IM  4. Short interval between pregnancies affecting pregnancy, antepartum   Preterm labor symptoms  and general obstetric precautions including but not limited to vaginal bleeding, contractions, leaking of fluid and fetal movement were reviewed in detail with the patient.  Please refer to After Visit Summary for other counseling recommendations.   Return in about 2 weeks (around 06/04/2022) for in person.   LaLynnda ShieldsMD Faculty Attending Center for [facility]

## 2022-05-22 LAB — CBC
Hematocrit: 33.7 % — ABNORMAL LOW (ref 34.0–46.6)
Hemoglobin: 11.1 g/dL (ref 11.1–15.9)
MCH: 30.1 pg (ref 26.6–33.0)
MCHC: 32.9 g/dL (ref 31.5–35.7)
MCV: 91 fL (ref 79–97)
Platelets: 206 10*3/uL (ref 150–450)
RBC: 3.69 x10E6/uL — ABNORMAL LOW (ref 3.77–5.28)
RDW: 14.6 % (ref 11.7–15.4)
WBC: 10.3 10*3/uL (ref 3.4–10.8)

## 2022-05-22 LAB — HIV ANTIBODY (ROUTINE TESTING W REFLEX): HIV Screen 4th Generation wRfx: NONREACTIVE

## 2022-05-22 LAB — RPR: RPR Ser Ql: NONREACTIVE

## 2022-05-28 ENCOUNTER — Other Ambulatory Visit: Payer: Self-pay | Admitting: General Practice

## 2022-05-28 ENCOUNTER — Other Ambulatory Visit: Payer: Medicaid Other

## 2022-05-28 DIAGNOSIS — Z3493 Encounter for supervision of normal pregnancy, unspecified, third trimester: Secondary | ICD-10-CM

## 2022-05-29 LAB — GLUCOSE TOLERANCE, 2 HOURS W/ 1HR
Glucose, 1 hour: 209 mg/dL — ABNORMAL HIGH (ref 70–179)
Glucose, 2 hour: 175 mg/dL — ABNORMAL HIGH (ref 70–152)
Glucose, Fasting: 94 mg/dL — ABNORMAL HIGH (ref 70–91)

## 2022-06-01 DIAGNOSIS — O24419 Gestational diabetes mellitus in pregnancy, unspecified control: Secondary | ICD-10-CM | POA: Insufficient documentation

## 2022-06-01 NOTE — Progress Notes (Unsigned)
   PRENATAL VISIT NOTE  Subjective:  Heather Brock is a 31 y.o. [phone] at 62w1dbeing seen today for ongoing prenatal care.  She is currently monitored for the following issues for this high-risk pregnancy and has Fibroid uterus; LGSIL on Pap smear of cervix; Supervision of low-risk pregnancy; Short interval between pregnancies affecting pregnancy, antepartum; and GDM (gestational diabetes mellitus) on their problem list.  Patient reports no complaints.  Contractions: Not present. Vag. Bleeding: None.  Movement: Present. Denies leaking of fluid.   The following portions of the patient's history were reviewed and updated as appropriate: allergies, current medications, past family history, past medical history, past social history, past surgical history and problem list.   Objective:   Vitals:   06/04/22 0842  BP: 122/84  Pulse: (!) 105  Weight: 193 lb (87.5 kg)    Fetal Status: Fetal Heart Rate (bpm): 147 Fundal Height: 31 cm Movement: Present     General:  Alert, oriented and cooperative. Patient is in no acute distress.  Skin: Skin is warm and dry. No rash noted.   Cardiovascular: Normal heart rate noted  Respiratory: Normal respiratory effort, no problems with respiration noted  Abdomen: Soft, gravid, appropriate for gestational age.  Pain/Pressure: Absent     Pelvic: Cervical exam deferred        Extremities: Normal range of motion.  Edema: None  Mental Status: Normal mood and affect. Normal behavior. Normal judgment and thought content.   Assessment and Plan:  Pregnancy: G4P1021 at 221w1d. LGSIL on Pap smear of cervix Pap PP   2. Encounter for supervision of low-risk pregnancy in third trimester S/p Tdap Offered and recommended flu shot - pt declines Rh pos  3. Short interval between pregnancies affecting pregnancy, antepartum Getting serial growth - last on 10/2 and was 87%ile  4. Uterine leiomyoma, unspecified location Small, anterior and intramural  5. Diet controlled  gestational diabetes mellitus (GDM), antepartum Has not yet med with AnLevada DyWill get her scheduled ASAP.  CBGs not yet started - no supplies yet sent for pt. Will work on this asap. Reviewed timing of testing and goals.  Next growth 11/20.  - Reviewed diagnosis of GDM - Discussed the risks associated in pregnancy especially with poor control including but not limited to increased risk of preeclampsia, macrosomia, need for operative delivery I.e. vacuum, forcep, c-section, shoulder dystocia and resulting potential nerve injury.  - Discussed diet and exercise modifications. - We discussed the possibility of management of the pregnancy with medications I.e. Metformin or Insulin.    Preterm labor symptoms and general obstetric precautions including but not limited to vaginal bleeding, contractions, leaking of fluid and fetal movement were reviewed in detail with the patient. Please refer to After Visit Summary for other counseling recommendations.   Return in about 2 weeks (around 06/18/2022) for OB VISIT, MD only.  Future Appointments  Date Time Provider DeLeb[place]11/20/2023 10:30 AM [facility]11/20/2023 10:45 AM W[facility]-MFC US5 W[facility]-MFCUS WMNorth [place]  PaRadene GunningMD

## 2022-06-04 ENCOUNTER — Encounter: Payer: Self-pay | Admitting: Obstetrics and Gynecology

## 2022-06-04 ENCOUNTER — Other Ambulatory Visit: Payer: Self-pay

## 2022-06-04 ENCOUNTER — Ambulatory Visit (INDEPENDENT_AMBULATORY_CARE_PROVIDER_SITE_OTHER): Payer: Medicaid Other | Admitting: Obstetrics and Gynecology

## 2022-06-04 VITALS — BP 122/84 | HR 105 | Wt 193.0 lb

## 2022-06-04 DIAGNOSIS — R87612 Low grade squamous intraepithelial lesion on cytologic smear of cervix (LGSIL): Secondary | ICD-10-CM

## 2022-06-04 DIAGNOSIS — D259 Leiomyoma of uterus, unspecified: Secondary | ICD-10-CM

## 2022-06-04 DIAGNOSIS — O09899 Supervision of other high risk pregnancies, unspecified trimester: Secondary | ICD-10-CM

## 2022-06-04 DIAGNOSIS — O09893 Supervision of other high risk pregnancies, third trimester: Secondary | ICD-10-CM

## 2022-06-04 DIAGNOSIS — Z3493 Encounter for supervision of normal pregnancy, unspecified, third trimester: Secondary | ICD-10-CM

## 2022-06-04 DIAGNOSIS — Z3A29 29 weeks gestation of pregnancy: Secondary | ICD-10-CM

## 2022-06-04 DIAGNOSIS — O24410 Gestational diabetes mellitus in pregnancy, diet controlled: Secondary | ICD-10-CM

## 2022-06-04 MED ORDER — ACCU-CHEK GUIDE W/DEVICE KIT: 1.0000 | PACK | Freq: Once | 0 refills | Status: AC

## 2022-06-04 MED ORDER — ACCU-CHEK SOFTCLIX LANCETS MISC: 12 refills | Status: DC

## 2022-06-04 MED ORDER — ACCU-CHEK GUIDE VI STRP: ORAL_STRIP | 12 refills | Status: DC

## 2022-06-04 NOTE — Addendum Note (Signed)
Addended by: Shelly Coss on: 06/04/2022 09:21 AM   Modules accepted: Orders

## 2022-06-18 ENCOUNTER — Encounter: Payer: Self-pay | Admitting: Obstetrics and Gynecology

## 2022-06-18 ENCOUNTER — Encounter: Payer: Medicaid Other | Attending: Student | Admitting: Registered"

## 2022-06-18 ENCOUNTER — Other Ambulatory Visit: Payer: Self-pay

## 2022-06-18 ENCOUNTER — Ambulatory Visit (INDEPENDENT_AMBULATORY_CARE_PROVIDER_SITE_OTHER): Payer: Medicaid Other | Admitting: Registered"

## 2022-06-18 ENCOUNTER — Ambulatory Visit (INDEPENDENT_AMBULATORY_CARE_PROVIDER_SITE_OTHER): Payer: Medicaid Other | Admitting: Obstetrics and Gynecology

## 2022-06-18 VITALS — BP 137/83 | HR 109 | Wt 198.5 lb

## 2022-06-18 DIAGNOSIS — Z3493 Encounter for supervision of normal pregnancy, unspecified, third trimester: Secondary | ICD-10-CM | POA: Diagnosis not present

## 2022-06-18 DIAGNOSIS — O24410 Gestational diabetes mellitus in pregnancy, diet controlled: Secondary | ICD-10-CM

## 2022-06-18 DIAGNOSIS — R87612 Low grade squamous intraepithelial lesion on cytologic smear of cervix (LGSIL): Secondary | ICD-10-CM | POA: Diagnosis not present

## 2022-06-18 DIAGNOSIS — Z3A00 Weeks of gestation of pregnancy not specified: Secondary | ICD-10-CM | POA: Diagnosis not present

## 2022-06-18 LAB — GLUCOSE, CAPILLARY: Glucose-Capillary: 118 mg/dL — ABNORMAL HIGH (ref 70–99)

## 2022-06-18 MED ORDER — ACCU-CHEK SOFTCLIX LANCETS MISC: 12 refills | Status: DC

## 2022-06-18 MED ORDER — ACCU-CHEK GUIDE W/DEVICE KIT: 1.0000 | PACK | Freq: Four times a day (QID) | 0 refills | Status: DC

## 2022-06-18 MED ORDER — ACCU-CHEK GUIDE VI STRP: ORAL_STRIP | 12 refills | Status: DC

## 2022-06-18 NOTE — Progress Notes (Unsigned)
Patient was seen for Gestational Diabetes self-management on 06/18/22  Start time 1550 and End time 16:18   Estimated due date: ***; [redacted]w[redacted]d Clinical: Medications: *** Medical History: *** Labs: OGTT [phone] A1c ***%   Dietary and Lifestyle History: ***  Physical Activity: *** Stress: *** Sleep: ***  24 hr Recall:  First Meal: *** Snack: Second meal: Snack: Third meal: Snack: Beverages:  NUTRITION INTERVENTION  Nutrition education (E-1) on the following topics:   Initial Follow-up  '[]'$  '[]'$  Definition of Gestational Diabetes '[]'$  '[]'$  Why dietary management is important in controlling blood glucose '[]'$  '[]'$  Effects each nutrient has on blood glucose levels '[]'$  '[]'$  Simple carbohydrates vs complex carbohydrates '[]'$  '[]'$  Fluid intake '[]'$  '[]'$  Creating a balanced meal plan '[]'$  '[]'$  Carbohydrate counting  '[]'$  '[]'$  When to check blood glucose levels '[]'$  '[]'$  Proper blood glucose monitoring techniques '[]'$  '[]'$  Effect of stress and stress reduction techniques  '[]'$  '[]'$  Exercise effect on blood glucose levels, appropriate exercise during pregnancy '[]'$  '[]'$  Importance of limiting caffeine and abstaining from alcohol and smoking '[]'$  '[]'$  Medications used for blood sugar control during pregnancy '[]'$  '[]'$  Hypoglycemia and rule of 15 '[]'$  '[]'$  Postpartum self care  Blood glucose monitor given: *** Lot # *** Exp: *** CBG: *** mg/dL  *** Patient has a meter prior to visit. Patient is *** testing pre breakfast and 2 hours after each meal. FBS: *** Postprandial: ***  Patient instructed to monitor glucose levels: FBS: 60 - ? 95 mg/dL (some clinics use 90 for cutoff) 1 hour: ? 140 mg/dL 2 hour: ? 120 mg/dL  Patient received handouts: Nutrition Diabetes and Pregnancy Carbohydrate Counting List  Patient will be seen for follow-up as needed.

## 2022-06-18 NOTE — Patient Instructions (Signed)

## 2022-06-18 NOTE — Progress Notes (Signed)
Subjective:  Heather Brock is a 31 y.o. [phone] at 1w1dbeing seen today for ongoing prenatal care.  She is currently monitored for the following issues for this high-risk pregnancy and has Fibroid uterus; LGSIL on Pap smear of cervix; Supervision of low-risk pregnancy; Short interval between pregnancies affecting pregnancy, antepartum; and GDM (gestational diabetes mellitus) on their problem list.  Patient reports no complaints.  Contractions: Irregular. Vag. Bleeding: None.  Movement: Present. Denies leaking of fluid.   The following portions of the patient's history were reviewed and updated as appropriate: allergies, current medications, past family history, past medical history, past social history, past surgical history and problem list. Problem list updated.  Objective:   Vitals:   06/18/22 1458  BP: 137/83  Pulse: (!) 109  Weight: 198 lb 8 oz (90 kg)    Fetal Status: Fetal Heart Rate (bpm): 140   Movement: Present     General:  Alert, oriented and cooperative. Patient is in no acute distress.  Skin: Skin is warm and dry. No rash noted.   Cardiovascular: Normal heart rate noted  Respiratory: Normal respiratory effort, no problems with respiration noted  Abdomen: Soft, gravid, appropriate for gestational age. Pain/Pressure: Absent     Pelvic:  Cervical exam deferred        Extremities: Normal range of motion.     Mental Status: Normal mood and affect. Normal behavior. Normal judgment and thought content.   Urinalysis:      Assessment and Plan:  Pregnancy: G4P1021 at 340w1d1. Encounter for supervision of low-risk pregnancy in third trimester Stable  2. Diet controlled gestational diabetes mellitus (GDM), antepartum Was able to have pt see DM educator this afternoon 2 hr PP 118 in office today Supplies sent to pharmacy Growth scan next week  3. LGSIL on Pap smear of cervix Repeat pap smear PP  Preterm labor symptoms and general obstetric precautions including but not  limited to vaginal bleeding, contractions, leaking of fluid and fetal movement were reviewed in detail with the patient. Please refer to After Visit Summary for other counseling recommendations.  Return in about 2 weeks (around 07/02/2022) for OB visit, face to face, MD only.   ErChancy MilroyMD

## 2022-06-25 ENCOUNTER — Ambulatory Visit: Payer: Medicaid Other | Attending: Obstetrics

## 2022-06-25 ENCOUNTER — Other Ambulatory Visit: Payer: Self-pay | Admitting: *Deleted

## 2022-06-25 ENCOUNTER — Ambulatory Visit: Payer: Medicaid Other | Admitting: *Deleted

## 2022-06-25 VITALS — BP 131/79 | HR 102

## 2022-06-25 DIAGNOSIS — O09893 Supervision of other high risk pregnancies, third trimester: Secondary | ICD-10-CM | POA: Diagnosis not present

## 2022-06-25 DIAGNOSIS — Z3A32 32 weeks gestation of pregnancy: Secondary | ICD-10-CM

## 2022-06-25 DIAGNOSIS — O24419 Gestational diabetes mellitus in pregnancy, unspecified control: Secondary | ICD-10-CM

## 2022-06-25 DIAGNOSIS — O99213 Obesity complicating pregnancy, third trimester: Secondary | ICD-10-CM

## 2022-06-25 DIAGNOSIS — O24410 Gestational diabetes mellitus in pregnancy, diet controlled: Secondary | ICD-10-CM

## 2022-06-25 DIAGNOSIS — E6609 Other obesity due to excess calories: Secondary | ICD-10-CM | POA: Insufficient documentation

## 2022-07-02 ENCOUNTER — Encounter: Payer: Medicaid Other | Attending: Obstetrics and Gynecology | Admitting: Registered"

## 2022-07-02 DIAGNOSIS — O24410 Gestational diabetes mellitus in pregnancy, diet controlled: Secondary | ICD-10-CM | POA: Insufficient documentation

## 2022-07-02 NOTE — Progress Notes (Signed)
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 Pt states she has to continue working until the baby is born because she is responsible to provide for herself and her 125-monthld child.   Breakfast includes pancakes, biscuits, waffles, boiled eggs, juice.   Pt states she didn't eat dinner last night because she was "nesting" 4 pm - 3 am, moving furniture to getting ready for baby. Pt was in pain and having a hard time walking.  Pt is using MySugr app and asked for help syncing with monitor.  Pt states she would like follow-up visits, however she is only available to come in on Mondays and there is no available Monday appointments before her due date.  Physical Activity: not assessed Stress: not assessed Sleep: not assessed  24 hr Recall:  First Meal: left overs from Thanksgiving Snack: Second meal: soup: noodle, chicken feet, liver, cabbage, red curry, cilantro, greens. Snack: 2 bite size choc chip cookies Third meal: Snack: Beverages: water, Sprite, coconut water, raspberry leaf tea  NUTRITION INTERVENTION  Nutrition education (E-1) on the following topics:   Initial Follow-up  '[x]'$  '[]'$  Definition of Gestational Diabetes '[]'$  '[]'$  Why dietary management is important in controlling blood glucose '[]'$  '[x]'$  Effects each nutrient has on blood glucose levels '[x]'$  '[x]'$  Simple carbohydrates vs complex carbohydrates '[x]'$  '[x]'$  Fluid intake '[x]'$  '[]'$  Creating a balanced meal plan '[]'$  '[]'$  Carbohydrate counting  '[x]'$  '[]'$  When to check blood glucose levels '[x]'$  '[]'$  Proper blood  glucose monitoring techniques '[]'$  '[]'$  Effect of stress and stress reduction techniques  '[]'$  '[]'$  Exercise effect on blood glucose levels, appropriate exercise during pregnancy '[]'$  '[]'$  Importance of limiting caffeine and abstaining from alcohol and smoking '[]'$  '[x]'$  Medications used for blood sugar control during pregnancy '[]'$  '[]'$  Hypoglycemia and rule of 15 '[x]'$  '[x]'$  Postpartum self care  Patient instructions:  Monitor glucose levels: FBS: 60 - ? 95 mg/dL; 2 hour: ? 120 mg/dL To help control blood sugar you should only drink unsweetened beverages and instead of drinking juice, have fruit with meals that also have protein. Read labels for protein content and total carbohydrate. Pure Protein bars have 20 g protein Take a pic of your breakfast to see what is good for your blood sugar. You may need medication to help control blood sugar, if you are started on oral medication, remember to take with food  Patient received handouts:  Patient will be seen for follow-up as needed.

## 2022-07-02 NOTE — Patient Instructions (Addendum)
To help control blood sugar you should only drink unsweetened beverages and instead of drinking juice, have fruit with meals that also have protein.  Read labels for protein content and total carbohydrate. Pure Protein bars have 20 g protein  Take a pic of your breakfast to see what is good for your blood sugar.  You may need medication to help control blood sugar, if you are started on oral medication, remember to take with food

## 2022-07-16 ENCOUNTER — Ambulatory Visit (INDEPENDENT_AMBULATORY_CARE_PROVIDER_SITE_OTHER): Payer: Medicaid Other | Admitting: Obstetrics and Gynecology

## 2022-07-16 ENCOUNTER — Encounter: Payer: Self-pay | Admitting: Obstetrics and Gynecology

## 2022-07-16 ENCOUNTER — Other Ambulatory Visit: Payer: Self-pay

## 2022-07-16 VITALS — BP 125/81 | HR 96 | Wt 205.6 lb

## 2022-07-16 DIAGNOSIS — Z3493 Encounter for supervision of normal pregnancy, unspecified, third trimester: Secondary | ICD-10-CM

## 2022-07-16 DIAGNOSIS — O24410 Gestational diabetes mellitus in pregnancy, diet controlled: Secondary | ICD-10-CM

## 2022-07-16 DIAGNOSIS — Z3A35 35 weeks gestation of pregnancy: Secondary | ICD-10-CM

## 2022-07-16 DIAGNOSIS — R87612 Low grade squamous intraepithelial lesion on cytologic smear of cervix (LGSIL): Secondary | ICD-10-CM

## 2022-07-16 NOTE — Patient Instructions (Signed)

## 2022-07-16 NOTE — Progress Notes (Signed)
Subjective:  Heather Brock is a 31 y.o. [phone] at 67w1dbeing seen today for ongoing prenatal care.  She is currently monitored for the following issues for this high-risk pregnancy and has Fibroid uterus; LGSIL on Pap smear of cervix; Supervision of low-risk pregnancy; Short interval between pregnancies affecting pregnancy, antepartum; and GDM (gestational diabetes mellitus) on their problem list.  Patient reports  general discomforts of pregnancy .   .  .   . Denies leaking of fluid.   The following portions of the patient's history were reviewed and updated as appropriate: allergies, current medications, past family history, past medical history, past social history, past surgical history and problem list. Problem list updated.  Objective:  There were no vitals filed for this visit.  Fetal Status:           General:  Alert, oriented and cooperative. Patient is in no acute distress.  Skin: Skin is warm and dry. No rash noted.   Cardiovascular: Normal heart rate noted  Respiratory: Normal respiratory effort, no problems with respiration noted  Abdomen: Soft, gravid, appropriate for gestational age.       Pelvic:  Cervical exam deferred        Extremities: Normal range of motion.     Mental Status: Normal mood and affect. Normal behavior. Normal judgment and thought content.   Urinalysis:      Assessment and Plan:  Pregnancy: G4P1021 at 344w1dtable GBS and vaginal cultures next week  GDM Not checking CBG's States "stresses her out" Reviewed importance of checking CBG's to direct medical care for her and her fetus to reduce potential complications with GDM. Pt states, "everything is fine, I know my body and I drink a lot of water" Growth scan next week  LGSIL Pap smear PP  There are no diagnoses linked to this encounter. Preterm labor symptoms and general obstetric precautions including but not limited to vaginal bleeding, contractions, leaking of fluid and fetal movement were  reviewed in detail with the patient. Please refer to After Visit Summary for other counseling recommendations.  No follow-ups on file.   ErChancy MilroyMD

## 2022-07-19 ENCOUNTER — Inpatient Hospital Stay (HOSPITAL_COMMUNITY)
Admission: AD | Admit: 2022-07-19 | Discharge: 2022-07-19 | Disposition: A | Discharge status: Home / Self Care | Payer: Medicaid Other | Attending: Obstetrics and Gynecology | Admitting: Obstetrics and Gynecology

## 2022-07-19 ENCOUNTER — Encounter: Payer: Self-pay | Admitting: *Deleted

## 2022-07-19 ENCOUNTER — Encounter (HOSPITAL_COMMUNITY): Payer: Self-pay | Admitting: Obstetrics and Gynecology

## 2022-07-19 DIAGNOSIS — Z711 Person with feared health complaint in whom no diagnosis is made: Secondary | ICD-10-CM | POA: Diagnosis not present

## 2022-07-19 DIAGNOSIS — Z3A35 35 weeks gestation of pregnancy: Secondary | ICD-10-CM | POA: Insufficient documentation

## 2022-07-19 DIAGNOSIS — O24419 Gestational diabetes mellitus in pregnancy, unspecified control: Secondary | ICD-10-CM | POA: Diagnosis not present

## 2022-07-19 DIAGNOSIS — Z3493 Encounter for supervision of normal pregnancy, unspecified, third trimester: Secondary | ICD-10-CM

## 2022-07-19 DIAGNOSIS — O3663X0 Maternal care for excessive fetal growth, third trimester, not applicable or unspecified: Secondary | ICD-10-CM | POA: Insufficient documentation

## 2022-07-19 DIAGNOSIS — O26893 Other specified pregnancy related conditions, third trimester: Secondary | ICD-10-CM | POA: Insufficient documentation

## 2022-07-19 DIAGNOSIS — O09899 Supervision of other high risk pregnancies, unspecified trimester: Secondary | ICD-10-CM

## 2022-07-19 DIAGNOSIS — O24410 Gestational diabetes mellitus in pregnancy, diet controlled: Secondary | ICD-10-CM

## 2022-07-19 LAB — URINALYSIS, ROUTINE W REFLEX MICROSCOPIC
Bilirubin Urine: NEGATIVE
Glucose, UA: NEGATIVE mg/dL
Hgb urine dipstick: NEGATIVE
Ketones, ur: NEGATIVE mg/dL
Leukocytes,Ua: NEGATIVE
Nitrite: NEGATIVE
Protein, ur: NEGATIVE mg/dL
Specific Gravity, Urine: 1.019 (ref 1.005–1.030)
pH: 6 (ref 5.0–8.0)

## 2022-07-19 NOTE — MAU Note (Signed)
.  Jacoya Bauman is a 31 y.o. at 38w4dhere in MAU reporting: had a OB appointment on Monday. Doctor told her since she did not check her Blood sugar  at home her" baby could die in the next 3 days."Today is day 3 and she is worried she is having some lower abd pain /pressure that feels like the baby has dropped today. Pain come and it last for about 5 min and then goes away. Reports good fetal movement and denies any vag bleeding or leaking. Denies any headache, or blurred vision. Has had some nausea.  LMP:  Onset of complaint: 3 days Pain score: 6 There were no vitals filed for this visit.   FHT:152 Lab orders placed from triage:  u/a

## 2022-07-19 NOTE — MAU Provider Note (Signed)
History     [id number]  Arrival date and time: 07/19/22 1446    Chief Complaint  Patient presents with   Abdominal Pain     HPI Heather Brock is a 31 y.o. at 31w4dwith PMHx notable for GDMA1, who presents for concerns regarding recent clinic visit.   [facility] Reports she felt very disrespected at that visit, like the provider did not listen to her and then told her that her baby might die Very worried that something might happen to her baby Reports she is checking her sugars about twice a day and shows me a consistent log which is mostly well controlled sugars Prior to past week was checking QID Denies vaginal bleeding, loss of fluid Having some contractions Endorses normal fetal movement   O/Positive/-- (08/14 1113)  OB History     Gravida  4   Para  1   Term  1   Preterm  0   AB  2   Living  1      SAB  0   IAB  2   Ectopic  0   Multiple  0   Live Births  1           Past Medical History:  Diagnosis Date   Chlamydia    Depression    when younger, ok now   Fibroid    Gonorrhea    Trichomonas exposure    Vaginal Pap smear, abnormal     Past Surgical History:  Procedure Laterality Date   NO PAST SURGERIES     THERAPEUTIC ABORTION     WISDOM TOOTH EXTRACTION      Family History  Problem Relation Age of Onset   Diabetes Father    Hyperlipidemia Father    Hypertension Father    Kidney failure Father     Social History   Socioeconomic History   Marital status: Single    Spouse name: Not on file   Number of children: Not on file   Years of education: Not on file   Highest education level: Not on file  Occupational History   Not on file  Tobacco Use   Smoking status: Never   Smokeless tobacco: Never   Tobacco comments:    2 packs /day- quit 2015  Vaping Use   Vaping Use: Never used  Substance and Sexual Activity   Alcohol use: Never   Drug use: Not Currently    Types:  Marijuana    Comment: stopped in Jan 2022   Sexual activity: Yes    Birth control/protection: None  Other Topics Concern   Not on file  Social History Narrative   Not on file   Social Determinants of Health   Financial Resource Strain: Not on file  Food Insecurity: No Food Insecurity (05/21/2022)   Hunger Vital Sign    Worried About Running Out of Food in the Last Year: Never true    Ran Out of Food in the Last Year: Never true  Recent Concern: Food Insecurity - Food Insecurity Present (04/23/2022)   Hunger Vital Sign    Worried About Running Out of Food in the Last Year: Sometimes true    Ran Out of Food in the Last Year: Sometimes true  Transportation Needs: No Transportation Needs (05/21/2022)   PRAPARE - THydrologist(Medical): No    Lack of Transportation (Non-Medical): No  Physical Activity: Not on file  Stress: Not on file  Social Connections: Not on file  Intimate Partner Violence: Not on file    Allergies  Allergen Reactions   Latex     Irritation with condoms    No current facility-administered medications on file prior to encounter.   Current Outpatient Medications on File Prior to Encounter  Medication Sig Dispense Refill   Accu-Chek Softclix Lancets lancets Use as instructed QID 100 each 12   Blood Glucose Monitoring Suppl (ACCU-CHEK GUIDE) w/Device KIT 1 each by Does not apply route 4 (four) times daily. 1 kit 0   Blood Pressure Monitoring (BLOOD PRESSURE CUFF) MISC 1 Device by Does not apply route as needed. 1 each 0   ferrous gluconate (FERGON) 324 MG tablet Take 1 tablet (324 mg total) by mouth daily with breakfast. 30 tablet 3   glucose blood (ACCU-CHEK GUIDE) test strip Use as instructed QID 100 each 12   Prenatal Vit-Fe Fumarate-FA (PRENATAL VITAMINS) 28-0.8 MG TABS Take 1 tablet by mouth daily. 30 tablet 6     ROS Pertinent positives and negative per HPI, all others reviewed and negative  Physical Exam   BP 128/78    Pulse (!) 110   Temp 98.4 F (36.9 C) (Oral)   Resp 18   Ht _0  (1.575 m)   Wt 93.4 kg   LMP 11/20/2021 (Approximate)   SpO2 99%   BMI 37.68 kg/m   Patient Vitals for the past 24 hrs:  BP Temp Temp src Pulse Resp SpO2 Height Weight  07/19/22 1550 128/78 -- -- (!) 110 -- 99 % -- --  07/19/22 1541 127/80 98.4 F (36.9 C) Oral (!) 116 18 99 % -- --  07/19/22 1527 117/77 -- -- (!) 106 18 -- _1  (1.575 m) 93.4 kg    Physical Exam Vitals reviewed.  Constitutional:      General: She is not in acute distress.    Appearance: She is well-developed. She is not diaphoretic.  Eyes:     General: No scleral icterus. Pulmonary:     Effort: Pulmonary effort is normal. No respiratory distress.  Abdominal:     General: There is no distension.     Palpations: Abdomen is soft.     Tenderness: There is no abdominal tenderness. There is no guarding or rebound.  Skin:    General: Skin is warm and dry.  Neurological:     Mental Status: She is alert.     Coordination: Coordination normal.      Cervical Exam    Bedside Ultrasound Not done  My interpretation: n/a  FHT Baseline 145, moderate variability, +accels, no decels Toco: irregular ctx Cat: I  Labs No results found for this or any previous visit (from the past 24 hour(s)).  Imaging No results found.  MAU Course  Procedures Lab Orders         Urinalysis, Routine w reflex microscopic Urine, Clean Catch    No orders of the defined types were placed in this encounter.  Imaging Orders  No imaging studies ordered today    MDM mild  Assessment and Plan  #Worried well #GDM #[redacted] weeks gestation of pregnancy Discussed with patient rationale for increased concern around her pregnancy, specifically US showing macrosomia with both EFW and AC >99% at last check as well as relative scarcity of recent glucose values. That being said, the values she does have are not particularly uncontrolled and it sounds like she has a  personal and family hx of large  babies. NST today is very reassuring as well. Provided reassurance that baby was ok, but that it is possible (and likely) that she will be recommended for a 37 wk IOL due to the above combination of factors to protect both her health and baby's health. Patient very appreciative of long (30+ minute) discussion, has follow up visit in four days at [facility].    #FWB FHT Cat I NST: Reactive   Dispo: discharged to home in stable condition.     Clarnce Flock, MD/MPH 07/19/22 4:43 PM  Allergies as of 07/19/2022       Reactions   Latex    Irritation with condoms        Medication List     TAKE these medications    Accu-Chek Guide test strip Generic drug: glucose blood Use as instructed QID   Accu-Chek Guide w/Device Kit 1 each by Does not apply route 4 (four) times daily.   Accu-Chek Softclix Lancets lancets Use as instructed QID   Blood Pressure Cuff Misc 1 Device by Does not apply route as needed.   ferrous gluconate 324 MG tablet Commonly known as: FERGON Take 1 tablet (324 mg total) by mouth daily with breakfast.   Prenatal Vitamins 28-0.8 MG Tabs Take 1 tablet by mouth daily.

## 2022-07-23 ENCOUNTER — Other Ambulatory Visit: Payer: Self-pay

## 2022-07-23 ENCOUNTER — Other Ambulatory Visit: Payer: Self-pay | Admitting: Obstetrics

## 2022-07-23 ENCOUNTER — Ambulatory Visit: Payer: Medicaid Other | Admitting: *Deleted

## 2022-07-23 ENCOUNTER — Ambulatory Visit (INDEPENDENT_AMBULATORY_CARE_PROVIDER_SITE_OTHER): Payer: Medicaid Other | Admitting: Medical

## 2022-07-23 ENCOUNTER — Ambulatory Visit: Payer: Medicaid Other | Attending: Obstetrics

## 2022-07-23 ENCOUNTER — Other Ambulatory Visit (HOSPITAL_COMMUNITY)
Admission: RE | Admit: 2022-07-23 | Discharge: 2022-07-23 | Disposition: A | Discharge status: Home / Self Care | Payer: Medicaid Other | Source: Ambulatory Visit | Attending: Medical | Admitting: Medical

## 2022-07-23 VITALS — BP 117/67 | HR 106

## 2022-07-23 VITALS — BP 123/83 | HR 113 | Wt 208.0 lb

## 2022-07-23 DIAGNOSIS — Z3A36 36 weeks gestation of pregnancy: Secondary | ICD-10-CM

## 2022-07-23 DIAGNOSIS — Z3493 Encounter for supervision of normal pregnancy, unspecified, third trimester: Secondary | ICD-10-CM

## 2022-07-23 DIAGNOSIS — E669 Obesity, unspecified: Secondary | ICD-10-CM

## 2022-07-23 DIAGNOSIS — O24410 Gestational diabetes mellitus in pregnancy, diet controlled: Secondary | ICD-10-CM | POA: Diagnosis present

## 2022-07-23 DIAGNOSIS — O0993 Supervision of high risk pregnancy, unspecified, third trimester: Secondary | ICD-10-CM

## 2022-07-23 DIAGNOSIS — O09893 Supervision of other high risk pregnancies, third trimester: Secondary | ICD-10-CM

## 2022-07-23 DIAGNOSIS — D259 Leiomyoma of uterus, unspecified: Secondary | ICD-10-CM

## 2022-07-23 DIAGNOSIS — O09899 Supervision of other high risk pregnancies, unspecified trimester: Secondary | ICD-10-CM

## 2022-07-23 DIAGNOSIS — O99213 Obesity complicating pregnancy, third trimester: Secondary | ICD-10-CM

## 2022-07-23 DIAGNOSIS — R87612 Low grade squamous intraepithelial lesion on cytologic smear of cervix (LGSIL): Secondary | ICD-10-CM

## 2022-07-24 ENCOUNTER — Encounter (HOSPITAL_COMMUNITY): Payer: Self-pay

## 2022-07-24 ENCOUNTER — Telehealth (HOSPITAL_COMMUNITY): Payer: Self-pay | Admitting: *Deleted

## 2022-07-24 LAB — GC/CHLAMYDIA PROBE AMP (~~LOC~~) NOT AT ARMC
Chlamydia: NEGATIVE
Comment: NEGATIVE
Comment: NORMAL
Neisseria Gonorrhea: NEGATIVE

## 2022-07-24 NOTE — Telephone Encounter (Signed)
Preadmission screen  

## 2022-07-25 ENCOUNTER — Telehealth (HOSPITAL_COMMUNITY): Payer: Self-pay | Admitting: *Deleted

## 2022-07-25 NOTE — Telephone Encounter (Signed)
Preadmission screen  

## 2022-07-25 NOTE — Progress Notes (Signed)
   PRENATAL VISIT NOTE  Subjective:  Heather Brock is a 31 y.o. [phone] at 21w3dbeing seen today for ongoing prenatal care.  She is currently monitored for the following issues for this high-risk pregnancy and has Fibroid uterus; LGSIL on Pap smear of cervix; Supervision of low-risk pregnancy; Short interval between pregnancies affecting pregnancy, antepartum; and GDM (gestational diabetes mellitus) on their problem list.  Patient reports fatigue.  Contractions: Irritability. Vag. Bleeding: None.  Movement: Present. Denies leaking of fluid.   The following portions of the patient's history were reviewed and updated as appropriate: allergies, current medications, past family history, past medical history, past social history, past surgical history and problem list.   Objective:   Vitals:   07/23/22 1536  BP: 123/83  Pulse: (!) 113  Weight: 208 lb (94.3 kg)    Fetal Status: Fetal Heart Rate (bpm): 151   Movement: Present     General:  Alert, oriented and cooperative. Patient is in no acute distress.  Skin: Skin is warm and dry. No rash noted.   Cardiovascular: Normal heart rate noted  Respiratory: Normal respiratory effort, no problems with respiration noted  Abdomen: Soft, gravid, appropriate for gestational age.  Pain/Pressure: Present     Pelvic: Cervical exam deferred        Extremities: Normal range of motion.     Mental Status: Normal mood and affect. Normal behavior. Normal judgment and thought content.   Assessment and Plan:  Pregnancy: G4P1021 at 346w3d. Supervision of high risk pregnancy in third trimester - GC/[place]lamydia probe amp ([place], beta strep (group b only)  2. Short interval between pregnancies affecting pregnancy, antepartum  3. LGSIL on Pap smear of cervix  4. Diet controlled gestational diabetes mellitus (GDM) in third trimester - Does not have log - [place] EFW > 99% - Recommend considering Metformin and delivery around 37  weeks - As patient will be delivered within the week, we did not start Metformin today -IOL scheduled 07/29/22  5. Uterine leiomyoma, unspecified location  6. [redacted] weeks gestation of pregnancy  Preterm labor symptoms and general obstetric precautions including but not limited to vaginal bleeding, contractions, leaking of fluid and fetal movement were reviewed in detail with the patient. Please refer to After Visit Summary for other counseling recommendations.   Return if symptoms worsen or fail to improve.  Future Appointments  Date Time Provider De[place]le12/24/2023  6:30 AM [facility] SC[place]    JuKerry HoughPA-C

## 2022-07-27 ENCOUNTER — Telehealth (HOSPITAL_COMMUNITY): Payer: Self-pay | Admitting: *Deleted

## 2022-07-27 LAB — CULTURE, BETA STREP (GROUP B ONLY): Strep Gp B Culture: NEGATIVE

## 2022-07-27 NOTE — Telephone Encounter (Signed)
Preadmission screen  

## 2022-07-29 ENCOUNTER — Inpatient Hospital Stay (HOSPITAL_COMMUNITY): Payer: Medicaid Other | Admitting: Anesthesiology

## 2022-07-29 ENCOUNTER — Inpatient Hospital Stay (HOSPITAL_COMMUNITY): Payer: Medicaid Other

## 2022-07-29 ENCOUNTER — Encounter (HOSPITAL_COMMUNITY): Payer: Self-pay | Admitting: Obstetrics and Gynecology

## 2022-07-29 ENCOUNTER — Encounter (HOSPITAL_COMMUNITY)
Admission: AD | Disposition: A | Discharge status: Home / Self Care | Payer: Self-pay | Source: Home / Self Care | Attending: Obstetrics and Gynecology

## 2022-07-29 ENCOUNTER — Other Ambulatory Visit: Payer: Self-pay

## 2022-07-29 ENCOUNTER — Inpatient Hospital Stay (HOSPITAL_COMMUNITY)
Admission: AD | Admit: 2022-07-29 | Discharge: 2022-07-31 | DRG: 768 | Disposition: A | Discharge status: Home / Self Care | Payer: Medicaid Other | Attending: Obstetrics and Gynecology | Admitting: Obstetrics and Gynecology

## 2022-07-29 DIAGNOSIS — O24419 Gestational diabetes mellitus in pregnancy, unspecified control: Secondary | ICD-10-CM

## 2022-07-29 DIAGNOSIS — O09899 Supervision of other high risk pregnancies, unspecified trimester: Secondary | ICD-10-CM

## 2022-07-29 DIAGNOSIS — O24420 Gestational diabetes mellitus in childbirth, diet controlled: Principal | ICD-10-CM | POA: Diagnosis present

## 2022-07-29 DIAGNOSIS — E119 Type 2 diabetes mellitus without complications: Secondary | ICD-10-CM | POA: Diagnosis not present

## 2022-07-29 DIAGNOSIS — Z3A37 37 weeks gestation of pregnancy: Secondary | ICD-10-CM

## 2022-07-29 DIAGNOSIS — O3413 Maternal care for benign tumor of corpus uteri, third trimester: Secondary | ICD-10-CM | POA: Diagnosis present

## 2022-07-29 DIAGNOSIS — Z6838 Body mass index (BMI) 38.0-38.9, adult: Secondary | ICD-10-CM

## 2022-07-29 DIAGNOSIS — O3663X0 Maternal care for excessive fetal growth, third trimester, not applicable or unspecified: Secondary | ICD-10-CM | POA: Diagnosis present

## 2022-07-29 DIAGNOSIS — D259 Leiomyoma of uterus, unspecified: Secondary | ICD-10-CM | POA: Diagnosis present

## 2022-07-29 DIAGNOSIS — E669 Obesity, unspecified: Secondary | ICD-10-CM

## 2022-07-29 DIAGNOSIS — O322XX0 Maternal care for transverse and oblique lie, not applicable or unspecified: Secondary | ICD-10-CM | POA: Diagnosis not present

## 2022-07-29 DIAGNOSIS — Z3490 Encounter for supervision of normal pregnancy, unspecified, unspecified trimester: Secondary | ICD-10-CM

## 2022-07-29 DIAGNOSIS — S3141XA Laceration without foreign body of vagina and vulva, initial encounter: Secondary | ICD-10-CM | POA: Diagnosis not present

## 2022-07-29 HISTORY — DX: Gestational diabetes mellitus in pregnancy, unspecified control: O24.419

## 2022-07-29 HISTORY — PX: PERINEAL LACERATION REPAIR: SHX5389

## 2022-07-29 LAB — COMPREHENSIVE METABOLIC PANEL
ALT: 19 U/L (ref 0–44)
AST: 27 U/L (ref 15–41)
Albumin: 2.7 g/dL — ABNORMAL LOW (ref 3.5–5.0)
Alkaline Phosphatase: 95 U/L (ref 38–126)
Anion gap: 10 (ref 5–15)
BUN: 8 mg/dL (ref 6–20)
CO2: 21 mmol/L — ABNORMAL LOW (ref 22–32)
Calcium: 9.1 mg/dL (ref 8.9–10.3)
Chloride: 106 mmol/L (ref 98–111)
Creatinine, Ser: 0.56 mg/dL (ref 0.44–1.00)
GFR, Estimated: >60 mL/min (ref >60)
Glucose, Bld: 127 mg/dL — ABNORMAL HIGH (ref 70–99)
Potassium: 3.8 mmol/L (ref 3.5–5.1)
Sodium: 137 mmol/L (ref 135–145)
Total Bilirubin: 0.7 mg/dL (ref 0.3–1.2)
Total Protein: 6.3 g/dL — ABNORMAL LOW (ref 6.5–8.1)

## 2022-07-29 LAB — GLUCOSE, CAPILLARY: Glucose-Capillary: 100 mg/dL — ABNORMAL HIGH (ref 70–99)

## 2022-07-29 LAB — CBC
HCT: 36.6 % (ref 36.0–46.0)
Hemoglobin: 12.6 g/dL (ref 12.0–15.0)
MCH: 31 pg (ref 26.0–34.0)
MCHC: 34.4 g/dL (ref 30.0–36.0)
MCV: 89.9 fL (ref 80.0–100.0)
Platelets: 155 10*3/uL (ref 150–400)
RBC: 4.07 MIL/uL (ref 3.87–5.11)
RDW: 14.7 % (ref 11.5–15.5)
WBC: 8.4 10*3/uL (ref 4.0–10.5)
nRBC: 0 % (ref 0.0–0.2)

## 2022-07-29 LAB — TYPE AND SCREEN
ABO/RH(D): O POS
Antibody Screen: NEGATIVE

## 2022-07-29 SURGERY — SUTURE REPAIR, LACERATION, PERINEUM
Anesthesia: Spinal

## 2022-07-29 MED ORDER — MIDAZOLAM HCL 2 MG/2ML IJ SOLN
INTRAMUSCULAR | Status: DC | PRN
  Administered 2022-07-29: 2 mg via INTRAVENOUS

## 2022-07-29 MED ORDER — ACETAMINOPHEN 325 MG PO TABS: 650.0000 mg | ORAL_TABLET | ORAL | Status: DC | PRN

## 2022-07-29 MED ORDER — TRANEXAMIC ACID-NACL 1000-0.7 MG/100ML-% IV SOLN: 1000.0000 mg | Freq: Once | INTRAVENOUS | Status: AC

## 2022-07-29 MED ORDER — FENTANYL CITRATE (PF) 100 MCG/2ML IJ SOLN
INTRAMUSCULAR | Status: AC
  Filled 2022-07-29: qty 2

## 2022-07-29 MED ORDER — CEFAZOLIN SODIUM-DEXTROSE 2-4 GM/100ML-% IV SOLN: 2.0000 g | Freq: Three times a day (TID) | INTRAVENOUS | Status: DC

## 2022-07-29 MED ORDER — OXYTOCIN-SODIUM CHLORIDE 30-0.9 UT/500ML-% IV SOLN: 2.5000 [IU]/h | INTRAVENOUS | Status: DC

## 2022-07-29 MED ORDER — SENNA 8.6 MG PO TABS: 1.0000 | ORAL_TABLET | Freq: Every day | ORAL | Status: DC

## 2022-07-29 MED ORDER — SOD CITRATE-CITRIC ACID 500-334 MG/5ML PO SOLN: 30.0000 mL | ORAL | Status: DC | PRN

## 2022-07-29 MED ORDER — LACTATED RINGERS IV SOLN: INTRAVENOUS | Status: DC

## 2022-07-29 MED ORDER — LACTATED RINGERS IV SOLN
500.0000 mL | INTRAVENOUS | Status: DC | PRN
  Administered 2022-07-29: 500 mL via INTRAVENOUS
  Administered 2022-07-29: 1000 mL via INTRAVENOUS

## 2022-07-29 MED ORDER — POLYETHYLENE GLYCOL 3350 17 G PO PACK: 17.0000 g | PACK | Freq: Every day | ORAL | Status: DC

## 2022-07-29 MED ORDER — CHLOROPROCAINE HCL (PF) 3 % IJ SOLN
INTRAMUSCULAR | Status: DC | PRN
  Administered 2022-07-29: 1.6 mL

## 2022-07-29 MED ORDER — MIDAZOLAM HCL 2 MG/2ML IJ SOLN
INTRAMUSCULAR | Status: AC
  Filled 2022-07-29: qty 2

## 2022-07-29 MED ORDER — FENTANYL CITRATE (PF) 100 MCG/2ML IJ SOLN
50.0000 ug | INTRAMUSCULAR | Status: DC | PRN
  Administered 2022-07-29 (×2): 100 ug via INTRAVENOUS
  Administered 2022-07-29: 15 ug via INTRAVENOUS
  Administered 2022-07-29: 85 ug via INTRAVENOUS
  Filled 2022-07-29 (×2): qty 2

## 2022-07-29 MED ORDER — CEFAZOLIN SODIUM-DEXTROSE 2-4 GM/100ML-% IV SOLN
2.0000 g | Freq: Three times a day (TID) | INTRAVENOUS | Status: DC
  Administered 2022-07-29: 2 g via INTRAVENOUS

## 2022-07-29 MED ORDER — TERBUTALINE SULFATE 1 MG/ML IJ SOLN: 0.2500 mg | Freq: Once | INTRAMUSCULAR | Status: DC | PRN

## 2022-07-29 MED ORDER — OXYCODONE-ACETAMINOPHEN 5-325 MG PO TABS: 1.0000 | ORAL_TABLET | ORAL | Status: DC | PRN

## 2022-07-29 MED ORDER — ONDANSETRON HCL 4 MG/2ML IJ SOLN
4.0000 mg | Freq: Four times a day (QID) | INTRAMUSCULAR | Status: DC | PRN
  Administered 2022-07-29: 4 mg via INTRAVENOUS

## 2022-07-29 MED ORDER — TRANEXAMIC ACID-NACL 1000-0.7 MG/100ML-% IV SOLN
INTRAVENOUS | Status: AC
  Administered 2022-07-29: 1000 mg via INTRAVENOUS
  Filled 2022-07-29: qty 100

## 2022-07-29 MED ORDER — OXYTOCIN-SODIUM CHLORIDE 30-0.9 UT/500ML-% IV SOLN
1.0000 m[IU]/min | INTRAVENOUS | Status: DC
  Administered 2022-07-29: 2 m[IU]/min via INTRAVENOUS
  Filled 2022-07-29: qty 500

## 2022-07-29 MED ORDER — BACITRACIN-NEOMYCIN-POLYMYXIN OINTMENT TUBE
TOPICAL_OINTMENT | Freq: Two times a day (BID) | CUTANEOUS | Status: DC
  Filled 2022-07-29: qty 14

## 2022-07-29 MED ORDER — DEXAMETHASONE SODIUM PHOSPHATE 10 MG/ML IJ SOLN
INTRAMUSCULAR | Status: DC | PRN
  Administered 2022-07-29: 10 mg via INTRAVENOUS

## 2022-07-29 MED ORDER — OXYCODONE-ACETAMINOPHEN 5-325 MG PO TABS: 2.0000 | ORAL_TABLET | ORAL | Status: DC | PRN

## 2022-07-29 MED ORDER — OXYTOCIN BOLUS FROM INFUSION: 333.0000 mL | Freq: Once | INTRAVENOUS | Status: DC

## 2022-07-29 MED ORDER — LIDOCAINE HCL (PF) 1 % IJ SOLN
30.0000 mL | INTRAMUSCULAR | Status: AC | PRN
  Administered 2022-07-29: 30 mL via SUBCUTANEOUS
  Filled 2022-07-29: qty 30

## 2022-07-29 SURGICAL SUPPLY — 2 items
SUT VIC AB 1 CT1 36 (SUTURE) IMPLANT
SUT VIC AB 2-0 CT1 (SUTURE) IMPLANT

## 2022-07-29 NOTE — Op Note (Signed)
Operative Note   07/29/2022  PRE-OP DIAGNOSIS: Concern for third degree perineal laceration. No anesthesia   POST-OP DIAGNOSIS: Same  SURGEON: Surgeon(s) and Role:    Aletha Halim, MD - Primary  ASSISTANT: None  PROCEDURE:  Exam under anesthesia, repair of 3c perineal laceration  ANESTHESIA: Spinal  ESTIMATED BLOOD LOSS: 59m  DRAINS: indwelling foley UOP per anesthesia note  TOTAL IV FLUIDS: per anesthesia note  SPECIMENS: none  VTE PROPHYLAXIS: SCDs to the bilateral lower extremities  ANTIBIOTICS: Ancef 2gm x 1 pre op  COMPLICATIONS: none  DISPOSITION: PACU - hemodynamically stable.  CONDITION: stable  FINDINGS: Exam under anesthesia revealed a 3c perineal laceration   PROCEDURE IN DETAIL:  After informed consent was obtained, the patient was taken to the operating room where anesthesia was obtained without difficulty. The patient was positioned in the dorsal [place]otomy position [place] The patient was examined under anesthesia, with the above noted findings.  After rectal exam, gloves changed and Allis clamps used to identify the edges of the external anal sphincter. Using 1-0 Vicryl three sutures were used to bring the edges together; these were tied separately and sequentially starting from first to last and front to back. Repeat rectal exam showed great approximation and suture palpated. Gloves changed and the remainder of the repair was done with 2-0 vicryl in the usual fashion. Final rectal exam negative.  Excellent hemostasis was noted, and all instruments were removed, with excellent hemostasis noted throughout.  She was then taken out of dorsal lithotomy.  The patient tolerated the procedure well.  Sponge, lap and instrument counts were correct x2.  The patient was taken to recovery room in excellent condition.  CDurene RomansMD Attending Center for [employer](Fish farm manager

## 2022-07-29 NOTE — Anesthesia Preprocedure Evaluation (Signed)
Anesthesia Evaluation  Patient identified by MRN, date of birth, ID band Patient awake    Reviewed: Allergy & Precautions, H&P , NPO status , Patient's Chart, lab work & pertinent test results  Airway Mallampati: II  TM Distance: >3 FB Neck ROM: Full    Dental no notable dental hx.    Pulmonary neg pulmonary ROS   Pulmonary exam normal breath sounds clear to auscultation       Cardiovascular negative cardio ROS Normal cardiovascular exam Rhythm:Regular Rate:Normal     Neuro/Psych    Depression    negative neurological ROS  negative psych ROS   GI/Hepatic negative GI ROS, Neg liver ROS,,,  Endo/Other  diabetes    Renal/GU negative Renal ROS  negative genitourinary   Musculoskeletal negative musculoskeletal ROS (+)    Abdominal  (+) + obese  Peds negative pediatric ROS (+)  Hematology negative hematology ROS (+)   Anesthesia Other Findings   Reproductive/Obstetrics negative OB ROS                             Anesthesia Physical Anesthesia Plan  ASA: 3 and emergent  Anesthesia Plan: Spinal   Post-op Pain Management:    Induction:   PONV Risk Score and Plan: 2 and Treatment may vary due to age or medical condition  Airway Management Planned: Natural Airway  Additional Equipment:   Intra-op Plan:   Post-operative Plan:   Informed Consent: I have reviewed the patients History and Physical, chart, labs and discussed the procedure including the risks, benefits and alternatives for the proposed anesthesia with the patient or authorized representative who has indicated his/her understanding and acceptance.     Dental advisory given  Plan Discussed with: CRNA  Anesthesia Plan Comments:        Anesthesia Quick Evaluation

## 2022-07-29 NOTE — Progress Notes (Signed)
Labor Progress Note  Heather Brock is a 31 y.o. [phone] at 57w0dpresented for IOL A1GDM, LGA  S: Pt feeling her contractions, tolerating them well, does not want epidural  O:  BP 132/71   Pulse (!) 109   Temp 98.1 F (36.7 C) (Oral)   Resp 18   Ht '5\' 2"'$  (1.575 m)   Wt 96.1 kg   LMP 11/20/2021 (Approximate)   BMI 38.74 kg/m  EFM: 145bpm/Moderate variability/ 15x15 accels/ None decels  CVE: Dilation: 6 Effacement (%): 70 Cervical Position: Middle Station: -1 Presentation: Vertex Exam by:: Mercado-Ortiz, MD   A&P: 31y.o. [id number] for IOL as above  #Labor: Progressing well. AROM blood tinged 1834, pitoicn increased to 1064m2x2 increase #Pain: Family/Friend support and PO/IV pain meds #FWB: CAT 1 #GBS negative #A1GDM: Pt ate 1 large meal and CBG 127. On liquids, clear. CBG q4h  JesShelda PalO FMOB Fellow, Faculty practice [facility] 07/29/22  6:47 PM

## 2022-07-29 NOTE — H&P (Signed)
Heather Brock is a 31 y.o. female [phone] with IUP at 31w0dby [place]resent[place] for [place] She reports +FMs, No LOF, no VB, no blurry vision, headaches or peripheral edema, and RUQ pain.  She plans on breat feeding. She request nothing for birth control. [facility]  Dating: By [place]--->  Estimated Date of Delivery: 08/19/22  Sono:   _0 , CWD, normal anatomy, Cephalic presentation, 43419Q 99% EFW   Prenatal History/Complications:  Patient Active Problem List   Diagnosis Date Noted   Gestational diabetes 07/29/2022   GDM (gestational diabetes mellitus) 06/01/2022   Supervision of low-risk pregnancy 02/21/2022   Short interval between pregnancies affecting pregnancy, antepartum 02/21/2022   LGSIL on Pap smear of cervix 02/27/2021   Fibroid uterus 09/21/2020     Past Medical History: Past Medical History:  Diagnosis Date   Chlamydia    Depression    when younger, ok now   Fibroid    Gonorrhea    Trichomonas exposure    Vaginal Pap smear, abnormal     Past Surgical History: Past Surgical History:  Procedure Laterality Date   NO PAST SURGERIES     THERAPEUTIC ABORTION     WISDOM TOOTH EXTRACTION      Obstetrical History: OB History     Gravida  4   Para  1   Term  1   Preterm  0   AB  2   Living  1      SAB  0   IAB  2   Ectopic  0   Multiple  0   Live Births  1           Social History Social History   Socioeconomic History   Marital status: Single    Spouse name: Not on file   Number of children: Not on file   Years of education: Not on file   Highest education level: 12th grade  Occupational History   Not on file  Tobacco Use   Smoking status: Never   Smokeless tobacco: Never   Tobacco comments:    2 packs /day- quit 2015  Vaping Use   Vaping Use: Never used  Substance and Sexual Activity   Alcohol use: Never   Drug use: Not Currently    Types: Marijuana     Comment: stopped in Jan 2022   Sexual activity: Yes    Birth control/protection: None  Other Topics Concern   Not on file  Social History Narrative   Not on file   Social Determinants of Health   Financial Resource Strain: Medium Risk (07/23/2022)   Overall Financial Resource Strain (CARDIA)    Difficulty of Paying Living Expenses: Somewhat hard  Food Insecurity: Food Insecurity Present (07/23/2022)   Hunger Vital Sign    Worried About Running Out of Food in the Last Year: Sometimes true    Ran Out of Food in the Last Year: Sometimes true  Transportation Needs: No Transportation Needs (07/23/2022)   PRAPARE - THydrologist(Medical): No    Lack of Transportation (Non-Medical): No  Physical Activity: Sufficiently Active (07/23/2022)   Exercise Vital Sign    Days of Exercise per Week: 7 days    Minutes of Exercise per Session: 80 min  Stress: No Stress Concern Present (07/23/2022)   FKeyes   Feeling of Stress :  Not at all  Social Connections: Socially Isolated (07/23/2022)   Social Connection and Isolation Panel [NHANES]    Frequency of Communication with Friends and Family: Once a week    Frequency of Social Gatherings with Friends and Family: Once a week    Attends Religious Services: Never    Marine scientist or Organizations: No    Attends Music therapist: Not on file    Marital Status: Never married    Family History: Family History  Problem Relation Age of Onset   Diabetes Father    Hyperlipidemia Father    Hypertension Father    Kidney failure Father     Allergies: Allergies  Allergen Reactions   Latex     Irritation with condoms    Medications Prior to Admission  Medication Sig Dispense Refill Last Dose   Accu-Chek Softclix Lancets lancets Use as instructed QID 100 each 12    Blood Glucose Monitoring Suppl (ACCU-CHEK GUIDE) w/Device KIT 1  each by Does not apply route 4 (four) times daily. 1 kit 0    Blood Pressure Monitoring (BLOOD PRESSURE CUFF) MISC 1 Device by Does not apply route as needed. 1 each 0    ferrous gluconate (FERGON) 324 MG tablet Take 1 tablet (324 mg total) by mouth daily with breakfast. 30 tablet 3    glucose blood (ACCU-CHEK GUIDE) test strip Use as instructed QID 100 each 12    Prenatal Vit-Fe Fumarate-FA (PRENATAL VITAMINS) 28-0.8 MG TABS Take 1 tablet by mouth daily. 30 tablet 6      Review of Systems   All systems reviewed and negative except as stated in HPI  Blood pressure 135/79, pulse (!) 117, temperature 97.6 F (36.4 C), temperature source Oral, resp. rate 16, height _0  (1.575 m), weight 96.1 kg, last menstrual period 11/20/2021, not currently breastfeeding. General appearance: alert, cooperative, and appears stated age Lungs: clear to auscultation bilaterally Heart: regular rate and rhythm Abdomen: soft, non-tender; bowel sounds normal Extremities: Homans sign is negative, no sign of DVT Presentation: cephalic Fetal monitoringBaseline: 145 bpm, Variability: Good {> 6 bpm), Accelerations: Reactive, and Decelerations: Absent Uterine activityNone and Duration: 2-3 seconds Dilation: 3.5 Effacement (%): 70, 80 Station: -1 Exam by:: Glade Nurse, RNC   Prenatal labs: ABO, Rh: --/--/PENDING (12/24 1330) Antibody: PENDING (12/24 1330) Rubella: 2.68 (08/14 1113) RPR: Non Reactive (10/16 1006)  HBsAg: Negative (08/14 1113)  HIV: Non Reactive (10/16 1006)  GBS: Negative/-- (12/18 1705)  1 hr Glucola GDM Genetic screening  nml Anatomy US LGA  Prenatal Transfer Tool  Maternal Diabetes: Yes:  Diabetes Type:  Diet controlled Genetic Screening: Normal Maternal Ultrasounds/Referrals: Normal- LGA Fetal Ultrasounds or other Referrals:  None Maternal Substance Abuse:  No Significant Maternal Medications:  None Significant Maternal Lab Results:  Group B Strep negative Number of Prenatal  Visits:greater than 3 verified prenatal visits Other Comments:  None  Results for orders placed or performed during the hospital encounter of 07/29/22 (from the past 24 hour(s))  Type and screen [place]   Collection Time: 07/29/22  1:30 PM  Result Value Ref Range   ABO/RH(D) PENDING    Antibody Screen PENDING    Sample Expiration      08/01/2022,2359 Performed at [facility], [place] [address], [place], [place] [address]   CBC   Collection Time: 07/29/22  1:31 PM  Result Value Ref Range   WBC 8.4 4.0 - 10.5 K/uL   RBC 4.07 3.87 -  5.11 MIL/uL   Hemoglobin 12.6 12.0 - 15.0 g/dL   HCT 36.6 36.0 - 46.0 %   MCV 89.9 80.0 - 100.0 fL   MCH 31.0 26.0 - 34.0 pg   MCHC 34.4 30.0 - 36.0 g/dL   RDW 14.7 11.5 - 15.5 %   Platelets 155 150 - 400 K/uL   nRBC 0.0 0.0 - 0.2 %  Comprehensive metabolic panel   Collection Time: 07/29/22  1:33 PM  Result Value Ref Range   Sodium 137 135 - 145 mmol/L   Potassium 3.8 3.5 - 5.1 mmol/L   Chloride 106 98 - 111 mmol/L   CO2 21 (L) 22 - 32 mmol/L   Glucose, Bld 127 (H) 70 - 99 mg/dL   BUN 8 6 - 20 mg/dL   Creatinine, Ser 0.56 0.44 - 1.00 mg/dL   Calcium 9.1 8.9 - 10.3 mg/dL   Total Protein 6.3 (L) 6.5 - 8.1 g/dL   Albumin 2.7 (L) 3.5 - 5.0 g/dL   AST 27 15 - 41 U/L   ALT 19 0 - 44 U/L   Alkaline Phosphatase 95 38 - 126 U/L   Total Bilirubin 0.7 0.3 - 1.2 mg/dL   GFR, Estimated >60 >60 mL/min   Anion gap 10 5 - 15    Patient Active Problem List   Diagnosis Date Noted   Gestational diabetes 07/29/2022   GDM (gestational diabetes mellitus) 06/01/2022   Supervision of low-risk pregnancy 02/21/2022   Short interval between pregnancies affecting pregnancy, antepartum 02/21/2022   LGSIL on Pap smear of cervix 02/27/2021   Fibroid uterus 09/21/2020    Assessment/Plan:  Heather Brock is a 31 y.o. G4P1021 at 64w0dhere for IOL A1GDM  #Labor:Pitocin 2x2 #Pain: IV pain meds, epidural upon request #FWB: CAT 1 #ID:  GBS  neg #MOF: Breast #MOC: declined #Circ:  None #A1GDM: un compliance with BG checks at home. CBG q4h  #LGA: EFW >99%ile at 36.1w, tested to 3635g.   JShelda Pal DO  07/29/2022, 2:37 PM

## 2022-07-29 NOTE — Anesthesia Procedure Notes (Signed)
Spinal  Patient location during procedure: OB Start time: 07/29/2022 11:18 PM End time: 07/29/2022 11:23 PM Reason for block: surgical anesthesia Staffing Performed: anesthesiologist  Anesthesiologist: [name], MD Performed by: [name], MD Authorized by: [name], MD   Preanesthetic Checklist Completed: patient identified, IV checked, risks and benefits discussed, surgical consent, monitors and equipment checked, pre-op evaluation and timeout performed Spinal Block Patient position: sitting Prep: DuraPrep and site prepped and draped Patient monitoring: heart rate, cardiac monitor, continuous pulse ox and blood pressure Approach: midline Location: L3-4 Injection technique: single-shot Needle Needle type: Pencan  Needle gauge: 24 G Needle length: 10 cm Assessment Sensory level: T4 Events: CSF return

## 2022-07-29 NOTE — Progress Notes (Signed)
error 

## 2022-07-30 ENCOUNTER — Encounter (HOSPITAL_COMMUNITY): Payer: Self-pay | Admitting: Obstetrics and Gynecology

## 2022-07-30 ENCOUNTER — Other Ambulatory Visit: Payer: Self-pay

## 2022-07-30 LAB — RPR: RPR Ser Ql: NONREACTIVE

## 2022-07-30 LAB — GLUCOSE, CAPILLARY
Glucose-Capillary: 138 mg/dL — ABNORMAL HIGH (ref 70–99)
Glucose-Capillary: 152 mg/dL — ABNORMAL HIGH (ref 70–99)

## 2022-07-30 MED ORDER — ACETAMINOPHEN 325 MG PO TABS: 650.0000 mg | ORAL_TABLET | ORAL | Status: DC | PRN

## 2022-07-30 MED ORDER — IBUPROFEN 600 MG PO TABS
600.0000 mg | ORAL_TABLET | Freq: Four times a day (QID) | ORAL | Status: DC
  Administered 2022-07-30 – 2022-07-31 (×3): 600 mg via ORAL
  Filled 2022-07-30 (×4): qty 1

## 2022-07-30 MED ORDER — SENNOSIDES-DOCUSATE SODIUM 8.6-50 MG PO TABS
2.0000 | ORAL_TABLET | ORAL | Status: DC
  Administered 2022-07-30 – 2022-07-31 (×2): 2 via ORAL
  Filled 2022-07-30 (×3): qty 2

## 2022-07-30 MED ORDER — ACETAMINOPHEN 10 MG/ML IV SOLN
INTRAVENOUS | Status: DC | PRN
  Administered 2022-07-30: 1000 mg via INTRAVENOUS

## 2022-07-30 MED ORDER — OXYCODONE HCL 5 MG PO TABS: 5.0000 mg | ORAL_TABLET | Freq: Once | ORAL | Status: DC | PRN

## 2022-07-30 MED ORDER — DIPHENHYDRAMINE HCL 25 MG PO CAPS: 25.0000 mg | ORAL_CAPSULE | Freq: Four times a day (QID) | ORAL | Status: DC | PRN

## 2022-07-30 MED ORDER — DIBUCAINE (PERIANAL) 1 % EX OINT: 1.0000 | TOPICAL_OINTMENT | CUTANEOUS | Status: DC | PRN

## 2022-07-30 MED ORDER — POLYETHYLENE GLYCOL 3350 17 G PO PACK
17.0000 g | PACK | Freq: Every day | ORAL | Status: DC
  Administered 2022-07-30 – 2022-07-31 (×2): 17 g via ORAL
  Filled 2022-07-30 (×2): qty 1

## 2022-07-30 MED ORDER — COCONUT OIL OIL: 1.0000 | TOPICAL_OIL | Status: DC | PRN

## 2022-07-30 MED ORDER — SIMETHICONE 80 MG PO CHEW: 80.0000 mg | CHEWABLE_TABLET | ORAL | Status: DC | PRN

## 2022-07-30 MED ORDER — ONDANSETRON HCL 4 MG/2ML IJ SOLN: 4.0000 mg | INTRAMUSCULAR | Status: DC | PRN

## 2022-07-30 MED ORDER — TETANUS-DIPHTH-ACELL PERTUSSIS 5-2.5-18.5 LF-MCG/0.5 IM SUSY: 0.5000 mL | PREFILLED_SYRINGE | Freq: Once | INTRAMUSCULAR | Status: DC

## 2022-07-30 MED ORDER — MEPERIDINE HCL 25 MG/ML IJ SOLN: 6.2500 mg | INTRAMUSCULAR | Status: DC | PRN

## 2022-07-30 MED ORDER — HYDROMORPHONE HCL 1 MG/ML IJ SOLN: 0.2500 mg | INTRAMUSCULAR | Status: DC | PRN

## 2022-07-30 MED ORDER — ZOLPIDEM TARTRATE 5 MG PO TABS: 5.0000 mg | ORAL_TABLET | Freq: Every evening | ORAL | Status: DC | PRN

## 2022-07-30 MED ORDER — ONDANSETRON HCL 4 MG PO TABS: 4.0000 mg | ORAL_TABLET | ORAL | Status: DC | PRN

## 2022-07-30 MED ORDER — OXYCODONE HCL 5 MG/5ML PO SOLN: 5.0000 mg | Freq: Once | ORAL | Status: DC | PRN

## 2022-07-30 MED ORDER — BENZOCAINE-MENTHOL 20-0.5 % EX AERO
1.0000 | INHALATION_SPRAY | CUTANEOUS | Status: DC | PRN
  Administered 2022-07-30: 1 via TOPICAL
  Filled 2022-07-30: qty 56

## 2022-07-30 MED ORDER — WITCH HAZEL-GLYCERIN EX PADS: 1.0000 | MEDICATED_PAD | CUTANEOUS | Status: DC | PRN

## 2022-07-30 MED ORDER — KETOROLAC TROMETHAMINE 30 MG/ML IJ SOLN: 30.0000 mg | Freq: Once | INTRAMUSCULAR | Status: DC | PRN

## 2022-07-30 MED ORDER — BACITRACIN-NEOMYCIN-POLYMYXIN OINTMENT TUBE
TOPICAL_OINTMENT | Freq: Three times a day (TID) | CUTANEOUS | Status: DC
  Filled 2022-07-30 (×2): qty 14

## 2022-07-30 MED ORDER — PRENATAL MULTIVITAMIN CH
1.0000 | ORAL_TABLET | Freq: Every day | ORAL | Status: DC
  Administered 2022-07-30 – 2022-07-31 (×2): 1 via ORAL
  Filled 2022-07-30 (×2): qty 1

## 2022-07-30 NOTE — Plan of Care (Signed)

## 2022-07-30 NOTE — Lactation Note (Signed)
This note was copied from a baby's chart. Lactation Consultation Note  Patient Name: Heather Brock NHRVA'C Date: 07/30/2022   Age:32 hours  LC attempted to visit with the birth parent.  She stated that she was trying to rest and would like to be seen tomorrow.  [place] congratulated the birth parent, placed the do not disturb sign on the door per her request, and exited the room.  Lactation will follow up tomorrow.    Feeding Nipple Type: Extra Slow Flow   Heather Brock P Heather Brock 07/30/2022, 6:09 PM

## 2022-07-30 NOTE — Discharge Summary (Signed)
Postpartum Discharge Summary  Date of Service updated***     Patient Name: Heather Brock DOB: [birth year] MRN: [id number]  Date of admission: 07/29/2022 Delivery date:07/29/2022  Delivering provider: Concepcion Living  Date of discharge: 07/30/2022  Admitting diagnosis: Gestational diabetes [O24.419] Intrauterine pregnancy: [redacted]w[redacted]d    Secondary diagnosis:  Principal Problem:   Gestational diabetes Active Problems:   Supervision of low-risk pregnancy   Short interval between pregnancies affecting pregnancy, antepartum   GDM (gestational diabetes mellitus)   Vaginal delivery   Shoulder dystocia during labor and delivery   Type 3c perineal laceration  Additional problems: ***    Discharge diagnosis: Term Pregnancy Delivered and GDM A1                                              Post partum procedures:{Postpartum procedures:23558} Augmentation: AROM and Pitocin Complications: Shoulder dystocia, 3C perineal laceration  Hospital course: Induction of Labor With Vaginal Delivery   31y.o. yo [id number] [id number] admitted to the hospital 07/29/2022 for induction of labor.  Indication for induction: A1 DM.  Patient had an labor course complicated by 2-minute shoulder dystocia resolved after [place], suprapubic pressure, with screw, delivery of posterior arm.  3C laceration requiring repair in the OR. Membrane Rupture Time/Date: 6:34 PM ,07/29/2022   Delivery Method:Vaginal, Spontaneous  Episiotomy: None  Lacerations:  3rd degree  Details of delivery can be found in separate delivery note.  Patient had a postpartum course complicated by***. Patient is discharged home 07/30/22.  Newborn Data: Birth date:07/29/2022  Birth time:10:17 PM  Gender:Female  Living status:Living  Apgars:3 ,8  Weight:4366 g   Magnesium Sulfate received: No BMZ received: No Rhophylac:N/A MMR:N/A T-DaP:Given prenatally Flu: No Transfusion:{Transfusion received:30440034}  Physical exam  Vitals:    07/29/22 1830 07/29/22 1905 07/29/22 1943 07/29/22 2244  BP: 132/71 (!) 118/97    Pulse: (!) 109 (!) 122    Resp: _0 Temp:   98 F (36.7 C) 98.2 F (36.8 C)  TempSrc:   Oral Oral  Weight:      Height:       General: {Exam; general:21111117} Lochia: {Desc; appropriate/inappropriate:30686::"appropriate"} Uterine Fundus: {Desc; firm/soft:30687} Incision: {Exam; incision:21111123} DVT Evaluation: {Exam; dvt:2111122} Labs: Lab Results  Component Value Date   WBC 8.4 07/29/2022   HGB 12.6 07/29/2022   HCT 36.6 07/29/2022   MCV 89.9 07/29/2022   PLT 155 07/29/2022      Latest Ref Rng & Units 07/29/2022    1:33 PM  CMP  Glucose 70 - 99 mg/dL 127   BUN 6 - 20 mg/dL 8   Creatinine 0.44 - 1.00 mg/dL 0.56   Sodium 135 - 145 mmol/L 137   Potassium 3.5 - 5.1 mmol/L 3.8   Chloride 98 - 111 mmol/L 106   CO2 22 - 32 mmol/L 21   Calcium 8.9 - 10.3 mg/dL 9.1   Total Protein 6.5 - 8.1 g/dL 6.3   Total Bilirubin 0.3 - 1.2 mg/dL 0.7   Alkaline Phos 38 - 126 U/L 95   AST 15 - 41 U/L 27   ALT 0 - 44 U/L 19    Edinburgh Score:    05/15/2021    3:47 PM  Edinburgh Postnatal Depression Scale Screening Tool  I have been able to laugh and see the funny side of things. 0  I have looked forward with enjoyment to things. 0  I have blamed myself unnecessarily when things went wrong. 0  I have been anxious or worried for no good reason. 0  I have felt scared or panicky for no good reason. 0  Things have been getting on top of me. 0  I have been so unhappy that I have had difficulty sleeping. 0  I have felt sad or miserable. 0  I have been so unhappy that I have been crying. 0  The thought of harming myself has occurred to me. 0  Edinburgh Postnatal Depression Scale Total 0     After visit meds:  Allergies as of 07/30/2022       Reactions   Latex    Irritation with condoms     Med Rec must be completed prior to using this [facility]***        Discharge home in stable  condition Infant Feeding: {Baby feeding:23562} Infant Disposition:{CHL IP OB HOME WITH GNFAOZ:30865} Discharge instruction: per After Visit Summary and Postpartum booklet. Activity: Advance as tolerated. Pelvic rest for 6 weeks.  Diet: {OB HQIO:96295284} Future Appointments:No future appointments. Follow up Visit: Message sent   Please schedule this patient for a In person postpartum visit in 32 weeks with the following provider: Any provider. Additional Postpartum F/U:2 hour GTT and Incision check 1 week  High risk pregnancy complicated by: GDM Delivery mode:  Vaginal, Spontaneous  Anticipated Birth Control:  Unsure   07/30/2022 Concepcion Living, MD

## 2022-07-30 NOTE — Progress Notes (Signed)
POSTPARTUM PROGRESS NOTE  Post Partum Day 1  Subjective:  Heather Brock is a 31 y.o. [id number] s/p SVD at [redacted]w[redacted]d  She reports she is doing well. No acute events overnight. She denies any problems with ambulating, voiding or po intake. Denies nausea or vomiting.  Pain is well controlled.  Lochia is appropriate.  Objective: Blood pressure 116/69, pulse (!) 102, temperature 98 F (36.7 C), resp. rate 20, height '5\' 2"'$  (1.575 m), weight 96.1 kg, last menstrual period 11/20/2021, SpO2 98 %, unknown if currently breastfeeding.  Physical Exam:  General: alert, cooperative and no distress Chest: no respiratory distress Heart:regular rate, distal pulses intact Abdomen: soft, nontender,  Uterine Fundus: firm, appropriately tender DVT Evaluation: No calf swelling or tenderness Extremities: trace edema Skin: warm, dry  Recent Labs    07/29/22 1331  HGB 12.6  HCT 36.6    Assessment/Plan: MMeleni Delahuntis a 31y.o. [id number]/p SVD w/ SD at 340w0d PPD#1 - Doing well  Routine postpartum care #C perineal laceration- on bowel regimen  Contraception: None Feeding: Breast Dispo: Plan for discharge tomorrow.   LOS: 1 day   ViGifford ShaveMD  OB Fellow  07/30/2022, 8:39 AM

## 2022-07-30 NOTE — Transfer of Care (Signed)
Immediate Anesthesia Transfer of Care Note  Patient: Heather Brock  Procedure(s) Performed: SUTURE REPAIR PERINEAL LACERATION  Patient Location: PACU  Anesthesia Type:Spinal  Level of Consciousness: awake, alert , oriented, and patient cooperative  Airway & Oxygen Therapy: Patient Spontanous Breathing  Post-op Assessment: Report given to RN and Post -op Vital signs reviewed and stable  Post vital signs: Reviewed and stable  Last Vitals:  Vitals Value Taken Time  BP    Temp    Pulse    Resp    SpO2      Last Pain:  Vitals:   07/29/22 2244  TempSrc: Oral  PainSc:          Complications: No notable events documented.

## 2022-07-30 NOTE — Lactation Note (Signed)
This note was copied from a baby's chart. Lactation Consultation Note  Patient Name: Heather Brock [id number] Date: 07/30/2022 Reason for consult: Initial assessment;Early term 37-38.6wks Age:32 hours Mom is BF/formula feeding until her milk supply comes in. Mom asked if she could pump as soon as LC came into rm. And introduced self. Mom has 3rd degree tear. Baby's face is very bruised. Discussed risk of jaundice. Mom stated her 1st child had jaundice. Mom has a 74 month old. Mom had breast changes during pregnancy. Hand expression demonstrated colostrum. Mom wanted to pump. Mom shown how to use DEBP & how to disassemble, clean, & reassemble parts. Suggested mom pump every 3 hrs. Mom pumping when LC left. Praised mom. Newborn feeding habits, STS, I&O, supply and demand reviewed. Mom encouraged to feed baby 8-12 times/24 hours and with feeding cues.  Encouraged mom to call for assistance as needed.  Maternal Data Has patient been taught Hand Expression?: Yes Does the patient have breastfeeding experience prior to this delivery?: Yes How long did the patient breastfeed?: 2 months  Feeding    LATCH Score Latch: Too sleepy or reluctant, no latch achieved, no sucking elicited.     Type of Nipple: Inverted (Lt. nipple inverted/ Rt. nipple everted)            Lactation Tools Discussed/Used Tools: Pump;Flanges Flange Size: 21 Breast pump type: Double-Electric Breast Pump Pump Education: Setup, frequency, and cleaning;Milk Storage Reason for Pumping: hx: low milk supply Pumping frequency: q3hr  Interventions Interventions: DEBP;Hand express;LC Services brochure;Pre-pump if needed;Shells;Breast compression (wear shell to Lt. nipple)  Discharge    Consult Status Consult Status: Follow-up Date: 07/30/22 (in afternoon) Follow-up type: In-patient    Rashanna Christiana, Elta Gu[place]oupe 07/30/2022, 3:22 AM

## 2022-07-30 NOTE — Anesthesia Postprocedure Evaluation (Signed)
Anesthesia Post Note  Patient: Heather Brock  Procedure(s) Performed: [place]     Patient location during evaluation: PACU Anesthesia Type: Spinal Level of consciousness: awake and alert Pain management: pain level controlled Vital Signs Assessment: post-procedure vital signs reviewed and stable Respiratory status: spontaneous breathing, nonlabored ventilation and respiratory function stable Cardiovascular status: blood pressure returned to baseline and stable Postop Assessment: no apparent nausea or vomiting Anesthetic complications: no   No notable events documented.  Last Vitals:  Vitals:   07/30/22 0100 07/30/22 0115  BP: 126/80 132/75  Pulse: (!) 102 86  Resp: (!) 21 (!) 24  Temp:    SpO2: 97% 98%    Last Pain:  Vitals:   07/30/22 0115  TempSrc:   PainSc: 0-No pain   Pain Goal:    LLE Motor Response: Purposeful movement (07/30/22 0115) LLE Sensation: Tingling (07/30/22 0115) RLE Motor Response: Purposeful movement (07/30/22 0115) RLE Sensation: Tingling (07/30/22 0115)     Epidural/Spinal Function Cutaneous sensation: Tingles (07/30/22 0115), Patient able to flex knees: Yes (07/30/22 0115), Patient able to lift hips off bed: No (07/30/22 0115), Back pain beyond tenderness at insertion site: No (07/30/22 0115), Progressively worsening motor and/or sensory loss: No (07/30/22 0115), Bowel and/or bladder incontinence post epidural: No (07/30/22 0115)  [name]

## 2022-07-31 LAB — GLUCOSE, CAPILLARY: Glucose-Capillary: 156 mg/dL — ABNORMAL HIGH (ref 70–99)

## 2022-07-31 MED ORDER — ACETAMINOPHEN 325 MG PO TABS: 650.0000 mg | ORAL_TABLET | ORAL | 0 refills | Status: AC | PRN

## 2022-07-31 MED ORDER — SENNOSIDES-DOCUSATE SODIUM 8.6-50 MG PO TABS: 2.0000 | ORAL_TABLET | ORAL | 0 refills | Status: AC

## 2022-07-31 MED ORDER — BENZOCAINE-MENTHOL 20-0.5 % EX AERO: 1.0000 | INHALATION_SPRAY | CUTANEOUS | 0 refills | Status: AC | PRN

## 2022-07-31 MED ORDER — IBUPROFEN 600 MG PO TABS: 600.0000 mg | ORAL_TABLET | Freq: Four times a day (QID) | ORAL | 0 refills | Status: AC

## 2022-07-31 MED ORDER — TRIPLE ANTIBIOTIC 3.5-400-5000 EX OINT: 1.0000 | TOPICAL_OINTMENT | Freq: Two times a day (BID) | CUTANEOUS | 0 refills | Status: AC

## 2022-07-31 NOTE — Progress Notes (Signed)
CSW received consult for hx of Anxiety and Depression. CSW met with MOB to offer support and complete assessment. When CSW entered room, MOB was observed laying in hospital bed holding and bonding with infant "Eros." CSW introduced self and explained reason for consult. MOB responded that she does not have depression or anxiety and is good in terms of mental health. MOB added that she endorsed depression "a long time ago when I was younger" but denies symptoms of depression and/or anxiety during pregnancy and since delivery. MOB shares she has a good support system, marked by her aunt, her nanny, and her boss. MOB denied current SI.  CSW inquired about resource needs. MOB shares that she does not currently receive WIC or food stamps but is interested in applying for both. MOB shares she uses her work phone and wifi on her cellphone to place calls. MOB requested contact information for [facility] office rather than CSW placing referrals. CSW provided information. MOB declined additional resource needs and declined food bank information. MOB reports she has all needed items for infant, including a car seat and bassinet. MOB declined review of Sudden Infant Death Syndrome (SIDS), sharing that she is aware of safe sleep precautions.   CSW provided education regarding the baby blues period vs. perinatal mood disorders, discussed treatment and gave resources for mental health follow up if concerns arise.  CSW recommends self-evaluation during the postpartum time period using the New Mom Checklist from Postpartum Progress and encouraged MOB to contact a medical professional if symptoms are noted at any time.    CSW identifies no further need for intervention and no barriers to discharge at this time.  Signed,  Berniece Salines, MSW, LCSWA, LCASA 07/31/2022 11:45 AM

## 2022-07-31 NOTE — Lactation Note (Signed)
This note was copied from a baby's chart. Lactation Consultation Note  Patient Name: Heather Brock ALPFX'T Date: 07/31/2022  Age:32 hours LC reviewed and updated the doc flow sheets with mom.  LC changed a large wet and yellowish brown stool.  LC offered to assist to see if the baby would latch, areola compressible.  Baby on and off with latch. Gardner suspects its because the baby has had so many bottles.  LC recommended when mom goes home to work on latching after the baby has had an appetizer of EBM or formula and then attempt to latch . Post pump both breast to enhance the milk coming in so the baby is more patient latching.  LC reviewed stressed the importance once the baby is latching more consistent ease down off the pumping.  LC reviewed BF D/C teaching, importance of the 1st 2 weeks of breast stimulation to get the milk to come in well.  LC offered to request and Lc O/P appt and mom receptive.    Maternal Data    Feeding Nipple Type: Extra Slow Flow  LATCH Score Latch: Repeated attempts needed to sustain latch, nipple held in mouth throughout feeding, stimulation needed to elicit sucking reflex.  Audible Swallowing: None  Type of Nipple: Everted at rest and after stimulation  Comfort (Breast/Nipple): Soft / non-tender  Hold (Positioning): Assistance needed to correctly position infant at breast and maintain latch.  LATCH Score: 6   Lactation Tools Discussed/Used    Interventions Interventions: Breast feeding basics reviewed;Assisted with latch;Skin to skin;Breast massage;Hand express;Breast compression;Adjust position;Support pillows;Position options;Shells;Hand pump;DEBP;Education;LC Services brochure  Discharge Discharge Education: Engorgement and breast care;Warning signs for feeding baby;Outpatient recommendation;Outpatient Epic message sent Pump: Personal;Manual;DEBP  Consult Status Consult Status: Complete Date: 07/31/22    Heather Brock 07/31/2022,  1:50 PM

## 2022-08-01 LAB — SURGICAL PATHOLOGY

## 2022-08-07 ENCOUNTER — Ambulatory Visit (INDEPENDENT_AMBULATORY_CARE_PROVIDER_SITE_OTHER): Payer: Medicaid Other | Admitting: Advanced Practice Midwife

## 2022-08-07 ENCOUNTER — Other Ambulatory Visit: Payer: Self-pay

## 2022-08-07 DIAGNOSIS — Z9889 Other specified postprocedural states: Secondary | ICD-10-CM

## 2022-08-07 NOTE — Progress Notes (Unsigned)
Ultrasounds Results Note  SUBJECTIVE HPI:  [name] is a 32 y.o. [id number] at 9 days postpartum here for check of 3c perineal laceration and shoulder dystocia. Baby is doing well. Pt reports one episode of passing some blood clots, but small amount of bleeding since. Still feels that vulva is swollen. Able to void but reports leaking urine. BMs Nml now w/ laxative but had constipation and painful BM initially. Hadn't start stool softener.   Past Medical History:  Diagnosis Date   Chlamydia    Depression    when younger, ok now   Fibroid    Gonorrhea    Trichomonas exposure    Vaginal Pap smear, abnormal    Past Surgical History:  Procedure Laterality Date   NO PAST SURGERIES     PERINEAL LACERATION REPAIR N/A 07/29/2022   Procedure: SUTURE REPAIR PERINEAL LACERATION;  Surgeon: Aletha Halim, MD;  Location: [facility] LD ORS;  Service: Gynecology;  Laterality: N/A;   THERAPEUTIC ABORTION     WISDOM TOOTH EXTRACTION     Social History   Socioeconomic History   Marital status: Single    Spouse name: Not on file   Number of children: Not on file   Years of education: Not on file   Highest education level: 12th grade  Occupational History   Not on file  Tobacco Use   Smoking status: Never   Smokeless tobacco: Never   Tobacco comments:    2 packs /day- quit 2015  Vaping Use   Vaping Use: Never used  Substance and Sexual Activity   Alcohol use: Never   Drug use: Not Currently    Types: Marijuana    Comment: stopped in Jan 2022   Sexual activity: Yes    Birth control/protection: None  Other Topics Concern   Not on file  Social History Narrative   Not on file   Social Determinants of Health   Financial Resource Strain: Medium Risk (07/23/2022)   Overall Financial Resource Strain (CARDIA)    Difficulty of Paying Living Expenses: Somewhat hard  Food Insecurity: Food Insecurity Present (07/23/2022)   Hunger Vital Sign    Worried About [place] in the Last Year:  Sometimes true    Ran Out of Food in the Last Year: Sometimes true  Transportation Needs: No Transportation Needs (07/23/2022)   PRAPARE - Hydrologist (Medical): No    Lack of Transportation (Non-Medical): No  Physical Activity: Sufficiently Active (07/23/2022)   Exercise Vital Sign    Days of Exercise per Week: 7 days    Minutes of Exercise per Session: 80 min  Stress: No Stress Concern Present (07/23/2022)   [place]    Feeling of Stress : Not at all  Social Connections: Socially Isolated (07/23/2022)   Social Connection and Isolation Panel [NHANES]    Frequency of Communication with Friends and Family: Once a week    Frequency of Social Gatherings with Friends and Family: Once a week    Attends Religious Services: Never    Marine scientist or Organizations: No    Attends Music therapist: Not on file    Marital Status: Never married  Intimate Partner Violence: Not on file   Current Outpatient Medications on File Prior to Visit  Medication Sig Dispense Refill   ibuprofen (ADVIL) 600 MG tablet Take 1 tablet (600 mg total) by mouth every 6 (six) hours. 30 tablet  0   acetaminophen (TYLENOL) 325 MG tablet Take 2 tablets (650 mg total) by mouth every 4 (four) hours as needed (for pain scale < 4). (Patient not taking: Reported on 08/07/2022) 30 tablet 0   benzocaine-Menthol (DERMOPLAST) 20-0.5 % AERO Apply 1 Application topically as needed for irritation (perineal discomfort). (Patient not taking: Reported on 08/07/2022) 78 g 0   ferrous gluconate (FERGON) 324 MG tablet Take 1 tablet (324 mg total) by mouth daily with breakfast. (Patient not taking: Reported on 08/07/2022) 30 tablet 3   senna-docusate (SENOKOT-S) 8.6-50 MG tablet Take 2 tablets by mouth daily. (Patient not taking: Reported on 08/07/2022) 30 tablet 0   No current facility-administered medications on file prior to  visit.   Allergies  Allergen Reactions   Latex     Irritation with condoms    I have reviewed patient's Past Medical Hx, Surgical Hx, Family Hx, Social Hx, medications and allergies.   Review of Systems Review of Systems  Constitutional: Negative for fever and chills.  Gastrointestinal: Negative for abdominal pain. Positive for constipation, now resolved w/ laxatives.  Genitourinary: Negative for vaginal bleeding. Pos for labial edema and leaking of urine.  Musculoskeletal: Negative for back pain.  Neurological: Negative for dizziness and weakness.    Physical Exam  BP 130/84   Pulse 96   LMP 11/20/2021 (Approximate)   Patient's last menstrual period was 11/20/2021 (approximate). GENERAL: Well-developed, well-nourished female in no acute distress.  HEENT: Normocephalic, atraumatic.   LUNGS: Effort normal ABDOMEN: Deferred HEART: Regular rate  SKIN: Warm, dry and without erythema PSYCH: Normal mood and affect NEURO: Alert and oriented x 4 PELVIC: Well-approximated 3c laceration. Perineum mildly edematous, already healing. Vagina partially inspected (due to pt discomfort.). Appears to be partially healed. Sutures in place. No erythema, drainage or evidence of infection.  ASSESSMENT 1. Obstetric vaginal laceration with type 3a third degree perineal laceration   - Healing appropriately   PLAN F/U PP visit Pt scheduled w/ Dr. Ilda Basset 1/10. Offered to cancel since healing well, but pt strongly desires to keep appt with him.  Go to MAU as needed for heavy bleeding, abdominal pain or fever greater than 100.4.  [place], [place] 08/07/2022 2:01 PM

## 2022-08-07 NOTE — Progress Notes (Unsigned)
Incision Check Visit  Heather Brock is here for incision check following 3C perineal laceration.   Assessment:  Education: Reviewed good wound care and s/s of infection with patient.  Patient will follow up post partum visit .

## 2022-08-15 ENCOUNTER — Ambulatory Visit (INDEPENDENT_AMBULATORY_CARE_PROVIDER_SITE_OTHER): Payer: Medicaid Other | Admitting: Obstetrics and Gynecology

## 2022-08-15 ENCOUNTER — Encounter: Payer: Self-pay | Admitting: Obstetrics and Gynecology

## 2022-08-15 ENCOUNTER — Other Ambulatory Visit: Payer: Self-pay

## 2022-08-15 NOTE — Progress Notes (Signed)
Obstetrics and Gynecology Visit Return Patient Evaluation  Appointment Date: 08/15/2022  Primary Care Provider: Pcp, No  OBGYN Clinic: [facility]  Chief Complaint: bottom check  History of Present Illness:  Heather Brock is a 32 y.o. G4P2 s/p 12/24 SVD with 3c laceration here for routine bottom check. Patient seen on 1/2 for routine check and was doing well  Interval History: Since that time, she states that she continues on the BM regimen and is doing well with no constipation or having to strain. Also no urinary or fecal incontinence. Bottom is just still sore but slowly improving.   Review of Systems: as noted in the History of Present Illness.  Patient Active Problem List   Diagnosis Date Noted   Type 3c perineal laceration 07/30/2022   Gestational diabetes 07/29/2022   Vaginal delivery 07/29/2022   Shoulder dystocia during labor and delivery 07/29/2022   GDM (gestational diabetes mellitus) 06/01/2022   Supervision of low-risk pregnancy 02/21/2022   Short interval between pregnancies affecting pregnancy, antepartum 02/21/2022   LGSIL on Pap smear of cervix 02/27/2021   Fibroid uterus 09/21/2020   Medications:  Anabel Halon had no medications administered during this visit. Current Outpatient Medications  Medication Sig Dispense Refill   ibuprofen (ADVIL) 600 MG tablet Take 1 tablet (600 mg total) by mouth every 6 (six) hours. 30 tablet 0   Prenatal MV-Min-Fe Fum-FA-DHA (PRENATAL 1 PO) Take by mouth.     acetaminophen (TYLENOL) 325 MG tablet Take 2 tablets (650 mg total) by mouth every 4 (four) hours as needed (for pain scale < 4). (Patient not taking: Reported on 08/07/2022) 30 tablet 0   benzocaine-Menthol (DERMOPLAST) 20-0.5 % AERO Apply 1 Application topically as needed for irritation (perineal discomfort). (Patient not taking: Reported on 08/07/2022) 78 g 0   ferrous gluconate (FERGON) 324 MG tablet Take 1 tablet (324 mg total) by mouth daily with  breakfast. (Patient not taking: Reported on 08/07/2022) 30 tablet 3   senna-docusate (SENOKOT-S) 8.6-50 MG tablet Take 2 tablets by mouth daily. (Patient not taking: Reported on 08/07/2022) 30 tablet 0   No current facility-administered medications for this visit.    Allergies: is allergic to latex.  Physical Exam:  BP 112/75   Pulse 90   Ht '5\' 2"'$  (1.575 m)   Wt 180 lb 4.8 oz (81.8 kg)   LMP 11/20/2021 (Exact Date)   Breastfeeding Yes   BMI 32.98 kg/m  Body mass index is 32.98 kg/m. General appearance: Well nourished, well developed female in no acute distress.  Neuro/Psych:  Normal mood and affect.    Pelvic exam:  EGBUS normal and introitus normal with sutures still in place, nttp and area is healing well with no e/o separation or infection. Same for rectal area (healing well)   Assessment: pt doing well  Plan:  1. Third degree perineal laceration Routine care. Pt amenable to pelvic floor PT given 3c laceration to help with healing - Ambulatory referral to Physical Therapy   RTC: 2-3wks for routine PP visit.   Durene Romans MD Attending [facility] [employer] [facility])

## 2022-08-29 NOTE — Therapy (Signed)
OUTPATIENT PHYSICAL THERAPY FEMALE PELVIC EVALUATION   Patient Name: Heather Brock MRN: [id number] DOB:[birth year], 32 y.o., female Today's Date: 08/30/2022  END OF SESSION:  PT End of Session - 08/30/22 1242     Visit Number 1    Date for PT Re-Evaluation 11/22/22    Authorization Type [employer]    PT Start Time 1030    PT Stop Time 1120    PT Time Calculation (min) 50 min    Activity Tolerance Patient tolerated treatment well    Behavior During Therapy WFL for tasks assessed/performed             Past Medical History:  Diagnosis Date   Chlamydia    Depression    when younger, ok now   Fibroid    Gonorrhea    Trichomonas exposure    Vaginal Pap smear, abnormal    Past Surgical History:  Procedure Laterality Date   NO PAST SURGERIES     PERINEAL LACERATION REPAIR N/A 07/29/2022   Procedure: SUTURE REPAIR PERINEAL LACERATION;  Surgeon: Aletha Halim, MD;  Location: [facility] LD [facility];  Service: Gynecology;  Laterality: N/A;   THERAPEUTIC ABORTION     WISDOM TOOTH EXTRACTION     Patient Active Problem List   Diagnosis Date Noted   Type 3c perineal laceration 07/30/2022   Gestational diabetes 07/29/2022   Vaginal delivery 07/29/2022   Shoulder dystocia during labor and delivery 07/29/2022   GDM (gestational diabetes mellitus) 06/01/2022   Supervision of low-risk pregnancy 02/21/2022   Short interval between pregnancies affecting pregnancy, antepartum 02/21/2022   LGSIL on Pap smear of cervix 02/27/2021   Fibroid uterus 09/21/2020    PCP: None  REFERRING PROVIDER: Aletha Halim, MD   REFERRING DIAG: O70.20 (ICD-10-CM) - Third degree perineal laceration   THERAPY DIAG:  Muscle weakness (generalized)  Perineal pain in female  Rationale for Evaluation and Treatment: Rehabilitation  ONSET DATE: 07/30/23  SUBJECTIVE:                                                                                                                                                                                            SUBJECTIVE STATEMENT: 07/30/23 SVD with 3c laceration. Patient has trouble sitting due to pain.   PAIN:  Are you having pain? Yes NPRS scale: 6/10 Pain location:  tailbone  Pain type: pressure Pain description: intermittent   Aggravating factors: sitting Relieving factors: stand, sitting on pillow, sit up straight  PRECAUTIONS: None  WEIGHT BEARING RESTRICTIONS: No  FALLS:  Has patient fallen in last 6 months? 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  PLOF: Independent  PATIENT GOALS: reduce pain with sitting and going back to gym  PERTINENT HISTORY:  Fibroid  BOWEL MOVEMENT: Pain with bowel movement: No Type of bowel movement:Type (Bristol Stool Scale) type 4, Frequency every 1-2 days, Strain No, and Splinting no Fully empty rectum: Yes:    URINATION: Pain with urination: No Fully empty bladder: Yes:   Stream: Strong Urgency: No Frequency: average Leakage: Sneezing  INTERCOURSE: Has not had intercourse due to just having a vaginal birth  PREGNANCY: Vaginal deliveries 2 Tearing Yes: third degree Currently pregnant No     OBJECTIVE:   PATIENT SURVEYS:  POPIQ-7 33  COGNITION: Overall cognitive status: Within functional limits for tasks assessed     SENSATION: Light touch: Appears intact Proprioception: Appears intact    POSTURE: decreased lumbar lordosis  PELVIC ALIGNMENT:  LUMBARAROM/PROM:  A/PROM A/PROM  eval  Flexion Decreased by 25% with tightness in the lumbar  Extension Decreased by 50% with tailbone pain  Left rotation Decreased by 25%   (Blank rows = not tested)  LOWER EXTREMITY ROM: bilateral hip ROM is full   LOWER EXTREMITY MMT:  MMT Right eval Left eval  Hip extension 3/5 5/5  Hip abduction 4/5 4/5  Hip adduction 5/5 5/5   PALPATION:   General  decreased movement of the L1-L5, sacral movement  hurts, tenderness on the sides of the sacrum and coccygeus; decreased SI joint movement                External Perineal Exam Not assessed yet                             Internal Pelvic Floor not assessed yet  Patient confirms identification and approves PT to assess internal pelvic floor and treatment No due to her not having her 6 week visit with her doctor yet.   PELVIC MMT:   MMT eval  Vaginal   Internal Anal Sphincter   External Anal Sphincter   Puborectalis   Diastasis Recti 2 finger width with good tension  (Blank rows = not tested)        TONE: Not assessed  PROLAPSE: Not assessed  TODAY'S TREATMENT:                                                                                                                              DATE: 08/30/22  EVAL See below  . PATIENT EDUCATION:  08/30/22 Education details: Access Code: [email or web address] Person educated: Patient Education method: Explanation, Demonstration, Tactile cues, Verbal cues, and Handouts Education comprehension: verbalized understanding, returned demonstration, verbal cues required, tactile cues required, and needs further education  HOME EXERCISE PROGRAM: 08/30/22 Access Code: [email or web address] URL: [email or web address] Date: 08/30/2022 Prepared by: Earlie Counts  Exercises - Prone Press Up  - 1 x daily - 7 x weekly - 3 sets - 10 reps -  Sidelying Thoracic Rotation with Open Book  - 1 x daily - 7 x weekly - 2 sets - 10 reps - Hooklying Transversus Abdominis Palpation  - 2 x daily - 7 x weekly - 1 sets - 10 reps  Patient Education - Abdominal Massage for Constipation  ASSESSMENT:  CLINICAL IMPRESSION: Patient is a 32 y.o. female who was seen today for physical therapy evaluation and treatment for Third degree perineal laceration . Patient had vaginal birth on 07/29/22 with a third degree perineal tear. She has pain in the coccyx area at level 6/10 when she sits. She has limited lumbar ROM. Decreased  bilateral SI joint mobility. She has tenderness located on the SI joint and bilateral coccygeal area. Therapist did not examine the pelvic floor yet due to her not having her 6 week postpartum follow up yet. Her rib cage is expanded. She has weakness in her hips and abdomen.  Patient has urinary leakage with sneezing. She will benefit from skilled therapy to reduce pain with sitting and assist her with increased strength to take care of her children.   OBJECTIVE IMPAIRMENTS: decreased activity tolerance, decreased coordination, decreased endurance, decreased mobility, decreased strength, increased fascial restrictions, increased muscle spasms, and pain.   ACTIVITY LIMITATIONS: sitting and caring for others  PARTICIPATION LIMITATIONS: occupation  PERSONAL FACTORS: Third degree perineal laceration  are also affecting patient's functional outcome.   REHAB POTENTIAL: Excellent  CLINICAL DECISION MAKING: Stable/uncomplicated  EVALUATION COMPLEXITY: Low   GOALS: Goals reviewed with patient? Yes  SHORT TERM GOALS: Target date: 09/27/22  Patient is independent with initial HEP for core engagement.  Baseline: not educated yet Goal status: INITIAL  2.  Patient educated on perineal scar massage to realign the scar.  Baseline: not educated yet Goal status: INITIAL   LONG TERM GOALS: Target date: 11/22/22  Patient independent with HEP for core and pelvic floor to reduce leakage and assist her in returning to the gym Baseline: not educated yet Goal status: INITIAL  2.  Patient able to sit for 1 hour with no pain due to improved mobility of pelvis and reduction of trigger points.  Baseline: not able to sit without pain level being 6/10.  Goal status: INITIAL  3.  Patient is able to sneeze without leaking urine due to improved mobility of perineal scar mobility.  Baseline: sneeze will leak urine Goal status: INITIAL   PLAN:  PT FREQUENCY: 1x/week  PT DURATION: 12 weeks  PLANNED  INTERVENTIONS: Therapeutic exercises, Therapeutic activity, Neuromuscular re-education, Patient/Family education, Joint mobilization, Dry Needling, Electrical stimulation, Spinal mobilization, Cryotherapy, Moist heat, scar mobilization, Biofeedback, and Manual therapy  PLAN FOR NEXT SESSION: work on lumbar and low thoracic, assess the perineal body and pelvic floor strength, advanced core strength, educate on perineal scar mobilization   Earlie Counts, PT 08/30/22 12:46 PM

## 2022-08-30 ENCOUNTER — Encounter: Payer: Medicaid Other | Attending: Obstetrics and Gynecology | Admitting: Physical Therapy

## 2022-08-30 ENCOUNTER — Encounter: Payer: Self-pay | Admitting: Physical Therapy

## 2022-08-30 ENCOUNTER — Other Ambulatory Visit: Payer: Self-pay

## 2022-08-30 DIAGNOSIS — R102 Pelvic and perineal pain: Secondary | ICD-10-CM | POA: Diagnosis present

## 2022-08-30 DIAGNOSIS — M6281 Muscle weakness (generalized): Secondary | ICD-10-CM | POA: Diagnosis present

## 2022-08-31 ENCOUNTER — Ambulatory Visit: Payer: Self-pay | Admitting: Obstetrics and Gynecology

## 2022-08-31 ENCOUNTER — Other Ambulatory Visit: Payer: Self-pay

## 2022-08-31 DIAGNOSIS — O24410 Gestational diabetes mellitus in pregnancy, diet controlled: Secondary | ICD-10-CM

## 2022-09-03 ENCOUNTER — Other Ambulatory Visit: Payer: Self-pay

## 2022-09-03 ENCOUNTER — Ambulatory Visit (INDEPENDENT_AMBULATORY_CARE_PROVIDER_SITE_OTHER): Payer: Medicaid Other | Admitting: Obstetrics and Gynecology

## 2022-09-03 ENCOUNTER — Other Ambulatory Visit: Payer: Medicaid Other

## 2022-09-03 DIAGNOSIS — O24410 Gestational diabetes mellitus in pregnancy, diet controlled: Secondary | ICD-10-CM

## 2022-09-03 DIAGNOSIS — R87612 Low grade squamous intraepithelial lesion on cytologic smear of cervix (LGSIL): Secondary | ICD-10-CM

## 2022-09-03 NOTE — Progress Notes (Unsigned)
    [place] Part[facility] Visit Note  Calliope Delangel is a 32 y.o. [id number] s/p SVD/3c laceration with delivery c/b shoulder dystocia at 37wks after IOL for GDMa1, LGA baby. Patient seen on 1/2 and 1/10 for routine check and was doing well     Anesthesia: epidural. Postpartum course has been going well. Baby is doing well. Baby is feeding by bottle - Similac Advance. Bleeding staining only. Bowel function is normal. Bladder function is normal. Patient is not sexually active. Contraception method is abstinence. Postpartum depression screening: negative.  Patient states she has no urinary or bowel movement problems or issues; patient had first pelvic PT visit on 1/25   Edinburgh Postnatal Depression Scale - 09/03/22 0924       Edinburgh Postnatal Depression Scale:  In the Past 7 Days   I have been able to laugh and see the funny side of things. 0    I have looked forward with enjoyment to things. 0    I have blamed myself unnecessarily when things went wrong. 0    I have been anxious or worried for no good reason. 0    I have felt scared or panicky for no good reason. 0    Things have been getting on top of me. 0    I have been so unhappy that I have had difficulty sleeping. 0    I have felt sad or miserable. 0    I have been so unhappy that I have been crying. 0    The thought of harming myself has occurred to me. 0    Edinburgh Postnatal Depression Scale Total 0            Review of Systems Pertinent items noted in HPI and remainder of comprehensive ROS otherwise negative.  Objective:  BP 130/87   Pulse 77   Wt 178 lb (80.7 kg)   LMP 11/20/2021 (Exact Date)   Breastfeeding No   BMI 32.56 kg/m    General: NAD Abdomen: soft, nttp Pelvic:  EGBUS normal Knot at the introitus causing some friability>>knot removed. Small suture strand next to the knot left in place. Area healing well. Nttp  Assessment:    Normal postpartum exam.   Plan:   *PP: *LSIL pap: ***  - Last pap smear   Diagnosis  Date Value Ref Range Status  11/17/2020 - Low grade squamous intraepithelial lesion (LSIL) (A)  Final   Pap smear {done:10129} at today's visit.  -Breast Cancer screening indicated? {indicated:25516}  7. Chronic Disease/Pregnancy Condition follow up: {Follow up:25499}  - PCP follow up  Aletha Halim, MD [facility]

## 2022-09-04 ENCOUNTER — Encounter: Payer: Self-pay | Admitting: Obstetrics and Gynecology

## 2022-09-04 LAB — GLUCOSE TOLERANCE, 2 HOURS
Glucose, 2 hour: 111 mg/dL (ref 70–139)
Glucose, GTT - Fasting: 96 mg/dL (ref 70–99)

## 2022-09-05 ENCOUNTER — Ambulatory Visit: Payer: Medicaid Other | Admitting: Physical Therapy

## 2022-09-10 ENCOUNTER — Ambulatory Visit: Payer: Medicaid Other | Attending: Obstetrics and Gynecology | Admitting: Physical Therapy

## 2022-09-10 ENCOUNTER — Encounter: Payer: Self-pay | Admitting: Physical Therapy

## 2022-09-10 DIAGNOSIS — M6281 Muscle weakness (generalized): Secondary | ICD-10-CM | POA: Diagnosis present

## 2022-09-10 DIAGNOSIS — R102 Pelvic and perineal pain: Secondary | ICD-10-CM | POA: Insufficient documentation

## 2022-09-10 NOTE — Therapy (Signed)
OUTPATIENT PHYSICAL THERAPY TREATMENT NOTE   Patient Name: Heather Brock MRN: [id number] DOB:09/06/1990, 32 y.o., female Today's Date: 09/10/2022  PCP: none REFERRING PROVIDER: Aletha Halim, MD   END OF SESSION:   PT End of Session - 09/10/22 1028     Visit Number 2    Date for PT Re-Evaluation 11/22/22    Authorization Type [employer]    PT Start Time 1028    PT Stop Time 1055    PT Time Calculation (min) 27 min    Activity Tolerance Patient tolerated treatment well    Behavior During Therapy WFL for tasks assessed/performed             Past Medical History:  Diagnosis Date   Chlamydia    Depression    when younger, ok now   Fibroid    Fibroid uterus 09/21/2020   8/1: 63%, 1946gm, ac 73%, afi 15.   5cm IM anterior on 5/27. No comment on 8/1 u/s. Rpt u/s prn    Gestational diabetes 07/29/2022   Gonorrhea    Trichomonas exposure    Vaginal Pap smear, abnormal    Past Surgical History:  Procedure Laterality Date   NO PAST SURGERIES     PERINEAL LACERATION REPAIR N/A 07/29/2022   Procedure: SUTURE REPAIR PERINEAL LACERATION;  Surgeon: Aletha Halim, MD;  Location: [facility] LD ORS;  Service: Gynecology;  Laterality: N/A;   THERAPEUTIC ABORTION     WISDOM TOOTH EXTRACTION     Patient Active Problem List   Diagnosis Date Noted   Type 3c perineal laceration 07/30/2022   LGSIL on Pap smear of cervix 02/27/2021   REFERRING DIAG: O70.20 (ICD-10-CM) - Third degree perineal laceration    THERAPY DIAG:  Muscle weakness (generalized)   Perineal pain in female   Rationale for Evaluation and Treatment: Rehabilitation   ONSET DATE: 07/30/23   SUBJECTIVE:                                                                                                                                                                                            SUBJECTIVE STATEMENT: I leak urine when I cough. I have to go to the bathroom once I have the urge.    PAIN:  Are you having pain?  Yes NPRS scale: 3/10 Pain location:  tailbone   Pain type: pressure Pain description: intermittent    Aggravating factors: sitting Relieving factors: stand, sitting on pillow, sit up straight   PRECAUTIONS: None   WEIGHT BEARING RESTRICTIONS: No   FALLS:  Has patient fallen in last 6 months? 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   PLOF: Independent   PATIENT GOALS: reduce pain with sitting and going back to gym   PERTINENT HISTORY:  Fibroid   BOWEL MOVEMENT: Pain with bowel movement: No Type of bowel movement:Type (Bristol Stool Scale) type 4, Frequency every 1-2 days, Strain No, and Splinting no Fully empty rectum: Yes:     URINATION: Pain with urination: No Fully empty bladder: Yes:   Stream: Strong Urgency: Urgency Frequency: average Leakage: Sneezing, when walking to the bathroom after she has the urgency   INTERCOURSE: Has not had intercourse due to just having a vaginal birth   PREGNANCY: Vaginal deliveries 2 Tearing Yes: third degree Currently pregnant No         OBJECTIVE:    PATIENT SURVEYS:  POPIQ-7 33   COGNITION: Overall cognitive status: Within functional limits for tasks assessed                          SENSATION: Light touch: Appears intact Proprioception: Appears intact       POSTURE: decreased lumbar lordosis   PELVIC ALIGNMENT:   LUMBARAROM/PROM:   A/PROM A/PROM  eval  Flexion Decreased by 25% with tightness in the lumbar  Extension Decreased by 50% with tailbone pain  Left rotation Decreased by 25%   (Blank rows = not tested)   LOWER EXTREMITY ROM: bilateral hip ROM is full     LOWER EXTREMITY MMT:   MMT Right eval Left eval  Hip extension 3/5 5/5  Hip abduction 4/5 4/5  Hip adduction 5/5 5/5    PALPATION:   General  decreased movement of the L1-L5, sacral movement hurts, tenderness on the sides of the sacrum and  coccygeus; decreased SI joint movement                 External Perineal Exam Not assessed yet                             Internal Pelvic Floor not assessed yet   Patient confirms identification and approves PT to assess internal pelvic floor and treatment No due to her not having her 6 week visit with her doctor yet.    PELVIC MMT:   MMT eval  Vaginal    Internal Anal Sphincter    External Anal Sphincter    Puborectalis    Diastasis Recti 2 finger width with good tension  (Blank rows = not tested)         TONE: Not assessed   PROLAPSE: Not assessed   TODAY'S TREATMENT:  09/10/22 Manual: Soft tissue mobilization: Manual work to bilateral levator ani in supine and hip adductors Educated patient on how to do manual work on the Lawyer and gluteals in sidely Exercises: Stretches/mobility: Piriformis stretch in supine bil. Holding 30 sec Cat cow Happy baby with diaphragmatic breathing Strength Quadruped hip extension Supine marching                                                                                                                                .  PATIENT EDUCATION:  09/10/22 Education details: Access Code: [email or web address] Person educated: Patient Education method: Explanation, Demonstration, Tactile cues, Verbal cues, and Handouts Education comprehension: verbalized understanding, returned demonstration, verbal cues required, tactile cues required, and needs further education   HOME EXERCISE PROGRAM: 09/10/22 Access Code: [email or web address] URL: [email or web address] Date: 09/10/2022 Prepared by: Earlie Counts  Program Notes massage along the gluteal fold as you lay on your side for 3 minutes each side.   Exercises - Prone Press Up  - 1 x daily - 7 x weekly - 3 sets - 10 reps - Sidelying Thoracic Rotation with Open Book  - 1 x daily - 7 x weekly - 2 sets - 10 reps - Hooklying Transversus Abdominis Palpation  - 2 x daily - 7 x weekly - 1 sets - 10  reps - Supine Diaphragmatic Breathing  - 2 x daily - 7 x weekly - 1 sets - 10 reps - Seated Diaphragmatic Breathing  - 2 x daily - 7 x weekly - 1 sets - 10 reps - Supine Piriformis Stretch  - 1 x daily - 7 x weekly - 1 sets - 2 reps - 30 sec hold - Happy Baby with Pelvic Floor Lengthening  - 1 x daily - 7 x weekly - 1 sets - 1 reps - 30-60 sec hold - Cat Cow  - 1 x daily - 7 x weekly - 1 sets - 10 reps   ASSESSMENT:   CLINICAL IMPRESSION: Patient is a 32 y.o. female who was seen today for physical therapy  treatment for Third degree perineal laceration . Patient pain decreased to 3/10. She has tenderness located on the levator ani, perineal body and the scar is not fully healed at the posterior fourchette. She had too much tenderness along the levator ani, the pelvic floor muscles assessment was not done. She has learned stretches and will go back tot he gym without weights. She will benefit from skilled therapy to reduce pain with sitting and assist her with increased strength to take care of her children.    OBJECTIVE IMPAIRMENTS: decreased activity tolerance, decreased coordination, decreased endurance, decreased mobility, decreased strength, increased fascial restrictions, increased muscle spasms, and pain.    ACTIVITY LIMITATIONS: sitting and caring for others   PARTICIPATION LIMITATIONS: occupation   PERSONAL FACTORS: Third degree perineal laceration  are also affecting patient's functional outcome.    REHAB POTENTIAL: Excellent   CLINICAL DECISION MAKING: Stable/uncomplicated   EVALUATION COMPLEXITY: Low     GOALS: Goals reviewed with patient? Yes   SHORT TERM GOALS: Target date: 09/27/22   Patient is independent with initial HEP for core engagement.  Baseline: not educated yet Goal status: INITIAL   2.  Patient educated on perineal scar massage to realign the scar.  Baseline: not educated yet Goal status: INITIAL     LONG TERM GOALS: Target date: 11/22/22   Patient  independent with HEP for core and pelvic floor to reduce leakage and assist her in returning to the gym Baseline: not educated yet Goal status: INITIAL   2.  Patient able to sit for 1 hour with no pain due to improved mobility of pelvis and reduction of trigger points.  Baseline: not able to sit without pain level being 6/10.  Goal status: INITIAL   3.  Patient is able to sneeze without leaking urine due to improved mobility of perineal scar mobility.  Baseline: sneeze will leak urine Goal status: INITIAL     PLAN:   PT FREQUENCY: 1x/week  PT DURATION: 12 weeks   PLANNED INTERVENTIONS: Therapeutic exercises, Therapeutic activity, Neuromuscular re-education, Patient/Family education, Joint mobilization, Dry Needling, Electrical stimulation, Spinal mobilization, Cryotherapy, Moist heat, scar mobilization, Biofeedback, and Manual therapy   PLAN FOR NEXT SESSION: work on lumbar and low thoracic, assess the  pelvic floor strength, advanced core strength for the gym,  educate on perineal scar mobilization     Earlie Counts, PT 09/10/22 11:45 AM

## 2022-09-26 ENCOUNTER — Encounter: Payer: Self-pay | Admitting: Physical Therapy

## 2022-09-26 ENCOUNTER — Ambulatory Visit: Payer: Medicaid Other | Admitting: Physical Therapy

## 2022-09-26 DIAGNOSIS — M6281 Muscle weakness (generalized): Secondary | ICD-10-CM

## 2022-09-26 DIAGNOSIS — R102 Pelvic and perineal pain: Secondary | ICD-10-CM

## 2022-09-26 NOTE — Therapy (Signed)
OUTPATIENT PHYSICAL THERAPY TREATMENT NOTE   Patient Name: Heather Brock MRN: [id number] DOB:[birth year], 32 y.o., female Today's Date: 09/26/2022  PCP: none  REFERRING PROVIDER: Aletha Halim, MD   END OF SESSION:   PT End of Session - 09/26/22 1357     Visit Number 3    Date for PT Re-Evaluation 11/22/22    Authorization Type [employer]    Authorization Time Period 08/30/2022-10/29/2022    Authorization - Visit Number 2    Authorization - Number of Visits 4    PT Start Time 1400    PT Stop Time 1440    PT Time Calculation (min) 40 min    Activity Tolerance Patient tolerated treatment well    Behavior During Therapy [facility] for tasks assessed/performed             Past Medical History:  Diagnosis Date   Chlamydia    Depression    when younger, ok now   Fibroid    Fibroid uterus 09/21/2020   8/1: 63%, 1946gm, ac 73%, afi 15.   5cm IM anterior on 5/27. No comment on 8/1 u/s. Rpt u/s prn    Gestational diabetes 07/29/2022   Gonorrhea    Trichomonas exposure    Vaginal Pap smear, abnormal    Past Surgical History:  Procedure Laterality Date   NO PAST SURGERIES     PERINEAL LACERATION REPAIR N/A 07/29/2022   Procedure: SUTURE REPAIR PERINEAL LACERATION;  Surgeon: Aletha Halim, MD;  Location: [facility] LD ORS;  Service: Gynecology;  Laterality: N/A;   THERAPEUTIC ABORTION     WISDOM TOOTH EXTRACTION     Patient Active Problem List   Diagnosis Date Noted   Type 3c perineal laceration 07/30/2022   LGSIL on Pap smear of cervix 02/27/2021   REFERRING DIAG: O70.20 (ICD-10-CM) - Third degree perineal laceration    THERAPY DIAG:  Muscle weakness (generalized)   Perineal pain in female   Rationale for Evaluation and Treatment: Rehabilitation   ONSET DATE: 07/30/23   SUBJECTIVE:                                                                                                                                                                                            SUBJECTIVE  STATEMENT: My tailbone is hurting a lot. My shoulder and neck hurt too. Started to get worse yesterday. I went back to work last Tuesday and have been sitting a lot. I have not leaked urine. I will leak when I cough if I have a full bladder.    PAIN:  Are you having pain? Yes NPRS scale: 8/10 Pain location:  tailbone  Pain type: pressure Pain description: intermittent    Aggravating factors: sitting Relieving factors: stand, sitting on pillow, sit up straight   PRECAUTIONS: None   WEIGHT BEARING RESTRICTIONS: No   FALLS:  Has patient fallen in last 6 months? 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   PLOF: Independent   PATIENT GOALS: reduce pain with sitting and going back to gym   PERTINENT HISTORY:  Fibroid   BOWEL MOVEMENT: Pain with bowel movement: No Type of bowel movement:Type (Bristol Stool Scale) type 4, Frequency every 1-2 days, Strain No, and Splinting no Fully empty rectum: Yes:     URINATION: Pain with urination: No Fully empty bladder: Yes:   Stream: Strong Urgency: Urgency Frequency: average Leakage: Sneezing, when walking to the bathroom after she has the urgency   INTERCOURSE: Has not had intercourse due to just having a vaginal birth   PREGNANCY: Vaginal deliveries 2 Tearing Yes: third degree Currently pregnant No         OBJECTIVE:    PATIENT SURVEYS:  POPIQ-7 33   COGNITION: Overall cognitive status: Within functional limits for tasks assessed                          SENSATION: Light touch: Appears intact Proprioception: Appears intact       POSTURE: decreased lumbar lordosis   PELVIC ALIGNMENT:   LUMBARAROM/PROM:   A/PROM A/PROM  eval  Flexion Decreased by 25% with tightness in the lumbar  Extension Decreased by 50% with tailbone pain  Left rotation Decreased by 25%   (Blank rows = not tested)   LOWER EXTREMITY ROM: bilateral  hip ROM is full     LOWER EXTREMITY MMT:   MMT Right eval Left eval  Hip extension 3/5 5/5  Hip abduction 4/5 4/5  Hip adduction 5/5 5/5    PALPATION:   General  decreased movement of the L1-L5, sacral movement hurts, tenderness on the sides of the sacrum and coccygeus; decreased SI joint movement                 External Perineal Exam Not assessed yet                             Internal Pelvic Floor not assessed yet   Patient confirms identification and approves PT to assess internal pelvic floor and treatment No due to her not having her 6 week visit with her doctor yet.    PELVIC MMT:   MMT eval 09/26/22  Vaginal   0/5  Internal Anal Sphincter     External Anal Sphincter     Puborectalis     Diastasis Recti 2 finger width with good tension   (Blank rows = not tested)         TONE: Not assessed   PROLAPSE: Not assessed   TODAY'S TREATMENT:  09/26/22 Manual: Internal pelvic floor techniques: No emotional/communication barriers or cognitive limitation. Patient is motivated to learn. Patient understands and agrees with treatment goals and plan. PT explains patient will be examined in standing, sitting, and lying down to see how their muscles and joints work. When they are ready, they will be asked to remove their underwear so PT can examine their perineum. The patient is also given the option of providing their own chaperone as one is  not provided in our facility. The patient also has the right and is explained the right to defer or refuse any part of the evaluation or treatment including the internal exam. With the patient's consent, PT will use one gloved finger to gently assess the muscles of the pelvic floor, seeing how well it contracts and relaxes and if there is muscle symmetry. After, the patient will get dressed and PT and patient will discuss exam findings and plan of care. PT and patient discuss plan of care, schedule, attendance policy and HEP activities.  Manual  work around the perineal body working on the third degree tear that is very sensitive so had to go very slowly.  Manual work to the introitus, and along the posterior fourchette Patient had to go to the bathroom to urinate and leg out gas during the manual work so did not have as long of treatment time Educated patient on how to perform manual work to the perineal body to work on the scar    09/10/22 Manual: Soft tissue mobilization: Manual work to bilateral levator ani in supine and hip adductors Educated patient on how to do manual work on the [employer] and gluteals in sidely Exercises: Stretches/mobility: Piriformis stretch in supine bil. Holding 30 sec Cat cow Happy baby with diaphragmatic breathing Strength Quadruped hip extension Supine marching                                                                                                                                 . PATIENT EDUCATION:  09/10/22 Education details: Access Code: [email or web address] Person educated: Patient Education method: Explanation, Demonstration, Tactile cues, Verbal cues, and Handouts Education comprehension: verbalized understanding, returned demonstration, verbal cues required, tactile cues required, and needs further education   HOME EXERCISE PROGRAM: 09/10/22 Access Code: [email or web address] URL: [email or web address] Date: 09/10/2022 Prepared by: Earlie Counts   Program Notes massage along the gluteal fold as you lay on your side for 3 minutes each side.    Exercises - Prone Press Up  - 1 x daily - 7 x weekly - 3 sets - 10 reps - Sidelying Thoracic Rotation with Open Book  - 1 x daily - 7 x weekly - 2 sets - 10 reps - Hooklying Transversus Abdominis Palpation  - 2 x daily - 7 x weekly - 1 sets - 10 reps - Supine Diaphragmatic Breathing  - 2 x daily - 7 x weekly - 1 sets - 10 reps - Seated Diaphragmatic Breathing  - 2 x daily - 7 x weekly - 1 sets - 10 reps - Supine Piriformis Stretch  - 1 x daily -  7 x weekly - 1 sets - 2 reps - 30 sec hold - Happy Baby with Pelvic Floor Lengthening  - 1 x daily - 7 x weekly - 1 sets - 1 reps - 30-60 sec hold - Cat Cow  - 1 x daily - 7 x weekly -  1 sets - 10 reps   ASSESSMENT:   CLINICAL IMPRESSION: Patient is a 32 y.o. female who was seen today for physical therapy  treatment for Third degree perineal laceration . Patient is having increased in tailbone pain since she has returned to work.  Patients Third degree perineal tear is thick and very sensitive with decreased mobility. She is not able to hold back gas due to the restrictions from the third degree tear. Patient has started on working on the perineal scar. Pelvic floor strength is 0/5. She will benefit from skilled therapy to reduce pain with sitting and assist her with increased strength to take care of her children.    OBJECTIVE IMPAIRMENTS: decreased activity tolerance, decreased coordination, decreased endurance, decreased mobility, decreased strength, increased fascial restrictions, increased muscle spasms, and pain.    ACTIVITY LIMITATIONS: sitting and caring for others   PARTICIPATION LIMITATIONS: occupation   PERSONAL FACTORS: Third degree perineal laceration  are also affecting patient's functional outcome.    REHAB POTENTIAL: Excellent   CLINICAL DECISION MAKING: Stable/uncomplicated   EVALUATION COMPLEXITY: Low     GOALS: Goals reviewed with patient? Yes   SHORT TERM GOALS: Target date: 09/27/22   Patient is independent with initial HEP for core engagement.  Baseline: not educated yet Goal status: INITIAL   2.  Patient educated on perineal scar massage to realign the scar.  Baseline: not educated yet Goal status: Met 09/26/22     LONG TERM GOALS: Target date: 11/22/22   Patient independent with HEP for core and pelvic floor to reduce leakage and assist her in returning to the gym Baseline: not educated yet Goal status: INITIAL   2.  Patient able to sit for 1 hour with  no pain due to improved mobility of pelvis and reduction of trigger points.  Baseline: not able to sit without pain level being 6/10.  Goal status: INITIAL   3.  Patient is able to sneeze without leaking urine due to improved mobility of perineal scar mobility.  Baseline: sneeze will leak urine Goal status: INITIAL     PLAN:   PT FREQUENCY: 1x/week   PT DURATION: 12 weeks   PLANNED INTERVENTIONS: Therapeutic exercises, Therapeutic activity, Neuromuscular re-education, Patient/Family education, Joint mobilization, Dry Needling, Electrical stimulation, Spinal mobilization, Cryotherapy, Moist heat, scar mobilization, Biofeedback, and Manual therapy   PLAN FOR NEXT SESSION: work on lumbar and low thoracic,  advanced core strength for the gym, work on the perineal body, pelvic floor muscles  Earlie Counts, PT 09/26/22 2:43 PM

## 2022-09-27 IMAGING — US US OB < 14 WEEKS - US OB TV
1 series · 15 of 28 positions shown · non-contrast
Comparison: None Available.

CLINICAL DATA: Pelvic pain, vaginal bleeding

EXAM:
OBSTETRIC <14 WK US AND TRANSVAGINAL OB US
TECHNIQUE: Both transabdominal and transvaginal ultrasound examinations were
performed for complete evaluation of the gestation as well as the
maternal uterus, adnexal regions, and pelvic cul-de-sac.
Transvaginal technique was performed to assess early pregnancy.

[Series 1: us ob < 14 weeks - us ob tv · 15 of 54 slices shown]
[im 1/54]
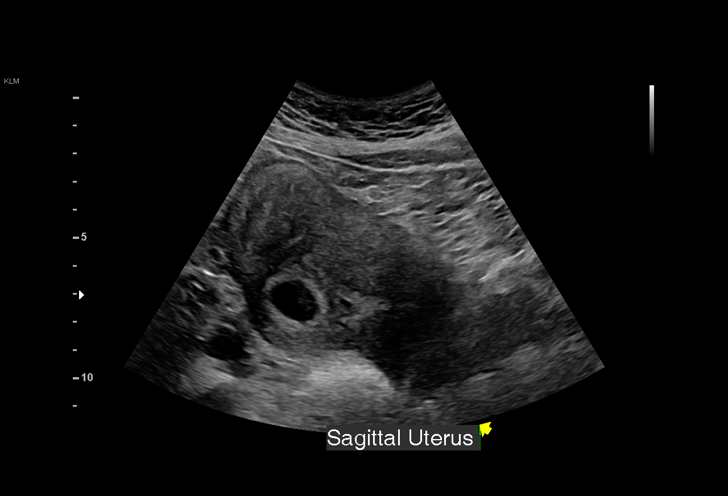
[im 4/54]
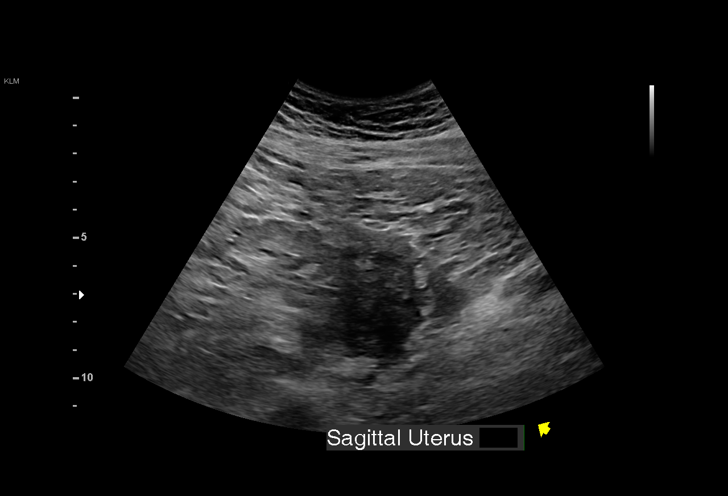
[im 8/54]
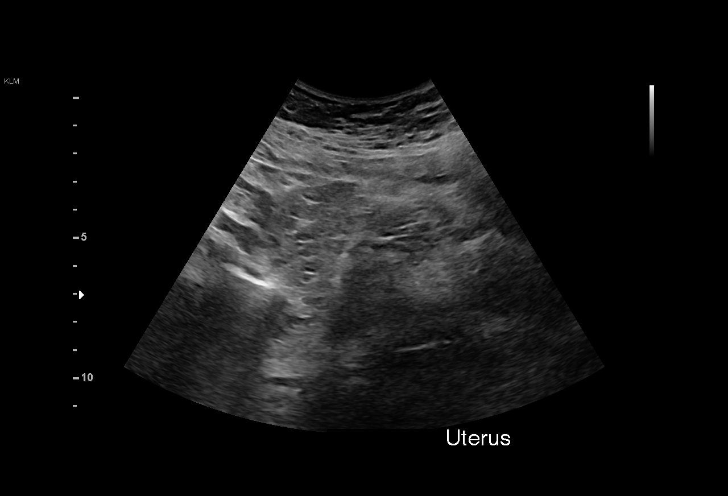
[im 12/54]
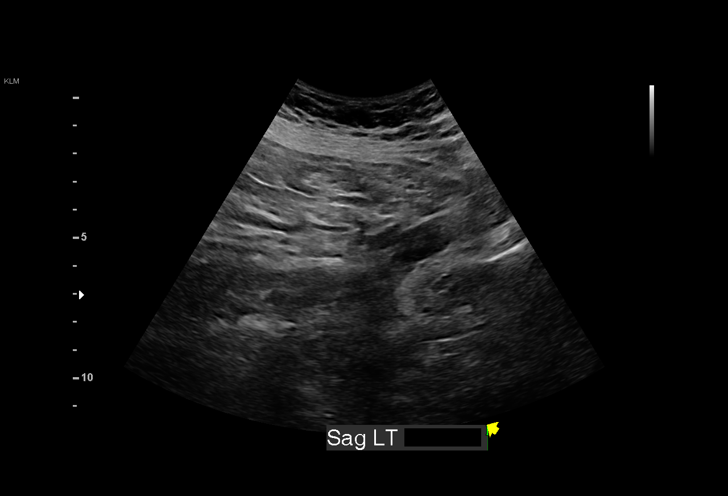
[im 16/54]
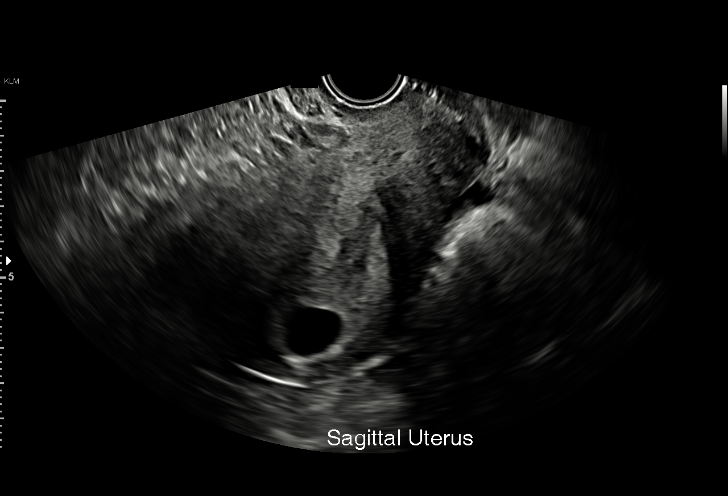
[im 20/54]
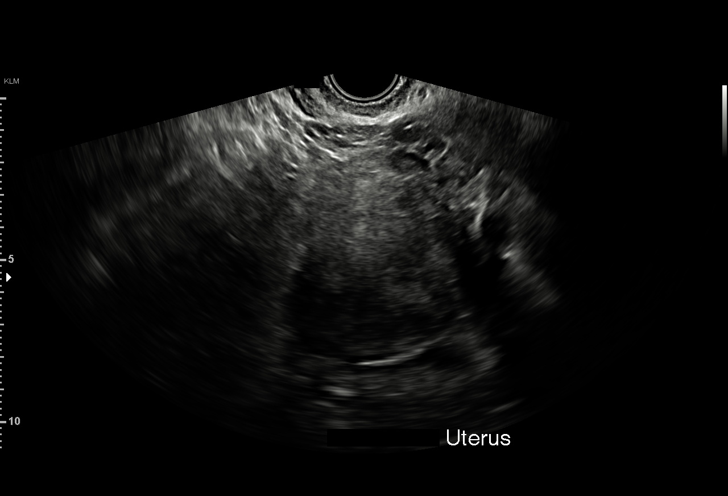
[im 24/54]
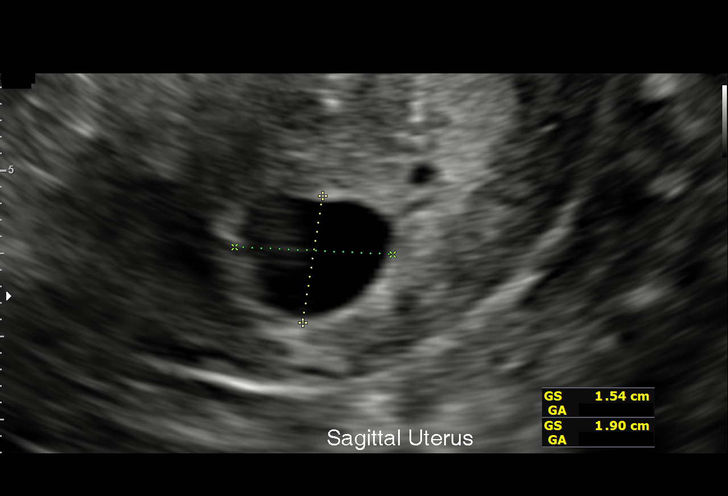
[im 28/54]
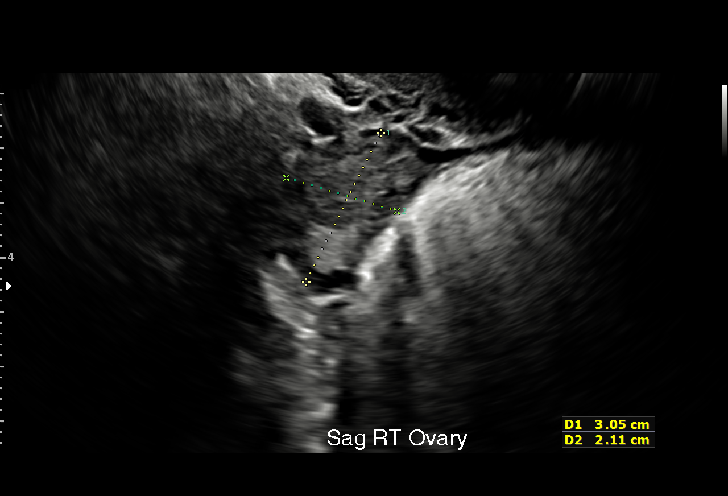
[im 30/54]
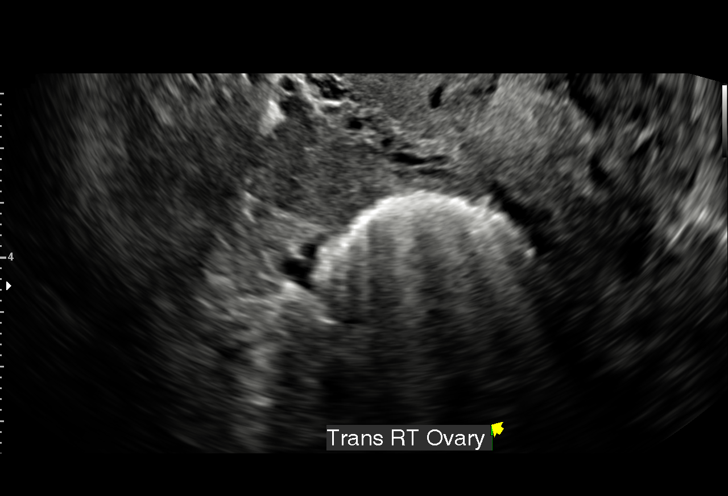
[im 34/54]
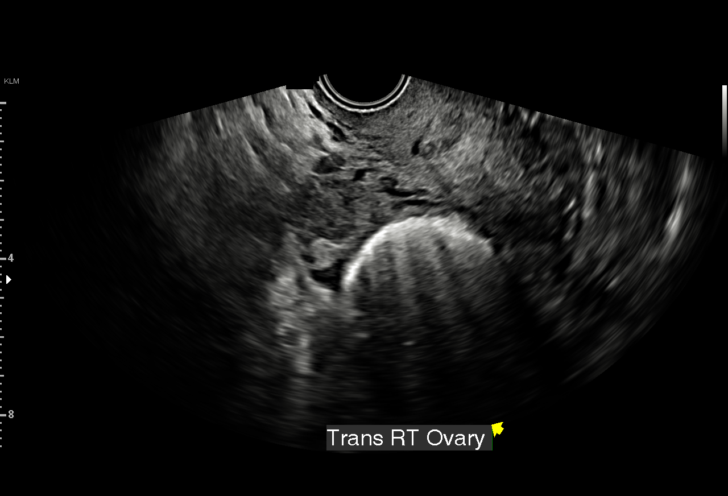
[im 38/54]
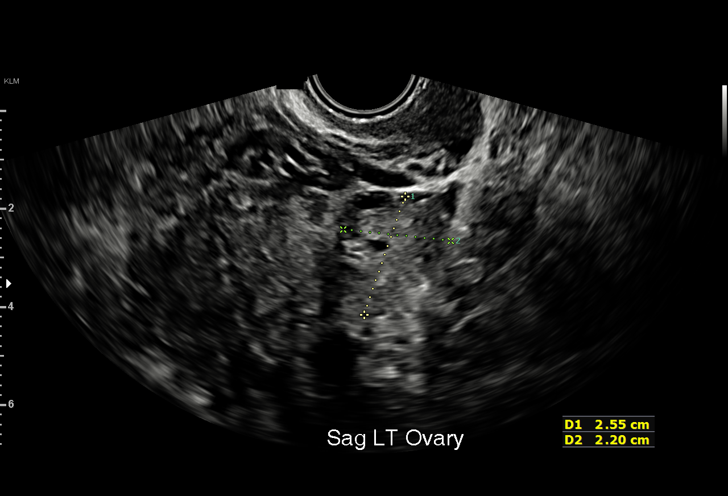
[im 42/54]
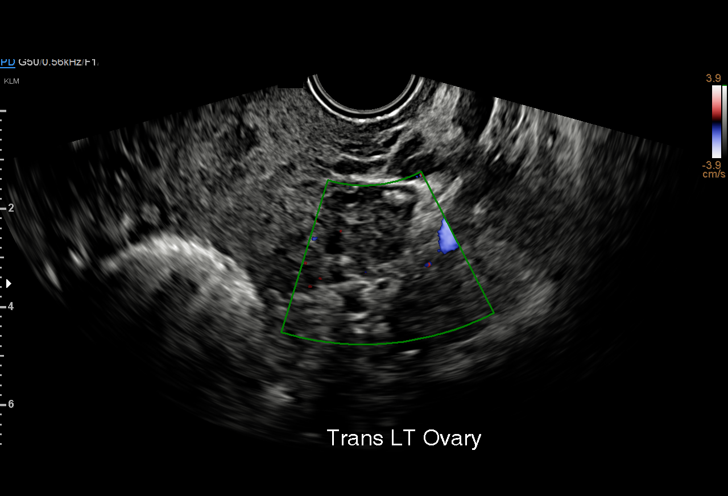
[im 46/54]
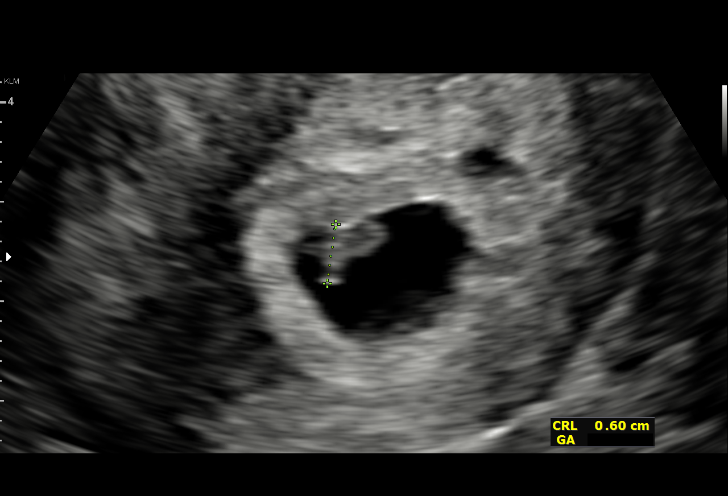
[im 50/54]
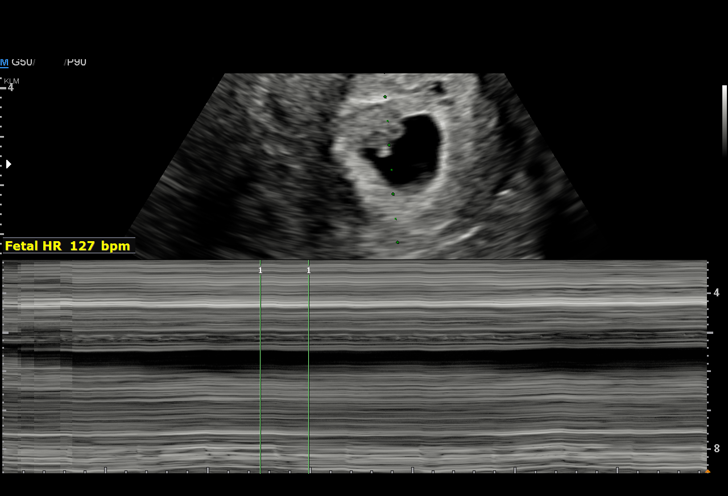
[im 54/54]
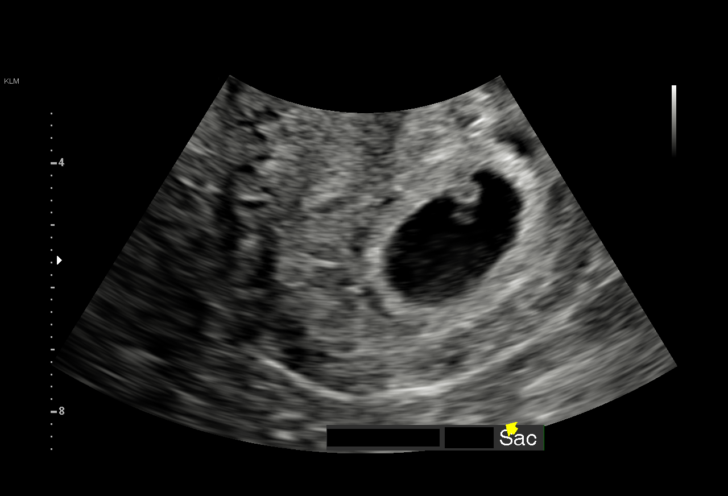

[15 of 28 positions shown; findings below may reference images not displayed]

FINDINGS: Intrauterine gestational sac: Single

Yolk sac:  Seen

Embryo:  Seen

Cardiac Activity: Seen

Heart Rate: 127 bpm

CRL:  6.2 mm   6 w   3 d                  US EDC: 08/19/2022

Subchorionic hemorrhage:  None visualized.

Maternal uterus/adnexae: Uterine cervix is closed. There is 1.7 cm
hypoechoic structure in the right adnexa, possibly hemorrhagic
cyst/follicle. There is no free fluid in the pelvis.
IMPRESSION: Single live intrauterine pregnancy seen. Sonographically estimated
gestational age is 6 weeks 3 days.

## 2022-10-02 ENCOUNTER — Encounter: Payer: Medicaid Other | Attending: Obstetrics and Gynecology | Admitting: Physical Therapy

## 2022-10-02 ENCOUNTER — Encounter: Payer: Self-pay | Admitting: Physical Therapy

## 2022-10-02 DIAGNOSIS — M6281 Muscle weakness (generalized): Secondary | ICD-10-CM

## 2022-10-02 DIAGNOSIS — R102 Pelvic and perineal pain: Secondary | ICD-10-CM | POA: Insufficient documentation

## 2022-10-02 NOTE — Therapy (Signed)
OUTPATIENT PHYSICAL THERAPY TREATMENT NOTE   Patient Name: Heather Brock MRN: [id number] DOB:1991/06/26, 32 y.o., female Today's Date: 10/02/2022  PCP:  none   REFERRING PROVIDER: Aletha Halim, MD    END OF SESSION:   PT End of Session - 10/02/22 1405     Visit Number 4    Date for PT Re-Evaluation 11/22/22    Authorization Type [employer]    Authorization Time Period 08/30/2022-10/29/2022    Authorization - Visit Number 3    Authorization - Number of Visits 4    PT Start Time 1400    PT Stop Time 1445    PT Time Calculation (min) 45 min    Activity Tolerance Patient tolerated treatment well    Behavior During Therapy [facility] for tasks assessed/performed             Past Medical History:  Diagnosis Date   Chlamydia    Depression    when younger, ok now   Fibroid    Fibroid uterus 09/21/2020   8/1: 63%, 1946gm, ac 73%, afi 15.   5cm IM anterior on 5/27. No comment on 8/1 u/s. Rpt u/s prn    Gestational diabetes 07/29/2022   Gonorrhea    Trichomonas exposure    Vaginal Pap smear, abnormal    Past Surgical History:  Procedure Laterality Date   NO PAST SURGERIES     PERINEAL LACERATION REPAIR N/A 07/29/2022   Procedure: SUTURE REPAIR PERINEAL LACERATION;  Surgeon: Aletha Halim, MD;  Location: [facility] LD ORS;  Service: Gynecology;  Laterality: N/A;   THERAPEUTIC ABORTION     WISDOM TOOTH EXTRACTION     Patient Active Problem List   Diagnosis Date Noted   Type 3c perineal laceration 07/30/2022   LGSIL on Pap smear of cervix 02/27/2021   REFERRING DIAG: O70.20 (ICD-10-CM) - Third degree perineal laceration    THERAPY DIAG:  Muscle weakness (generalized)   Perineal pain in female   Rationale for Evaluation and Treatment: Rehabilitation   ONSET DATE: 07/30/23   SUBJECTIVE:                                                                                                                                                                                            SUBJECTIVE  STATEMENT: Tailbone pain is getting better.     PAIN:  Are you having pain? Yes NPRS scale: 5/10 Pain location:  tailbone   Pain type: pressure Pain description: intermittent    Aggravating factors: sitting Relieving factors: stand, sitting on pillow, sit up straight   PRECAUTIONS: None   WEIGHT BEARING RESTRICTIONS: No   FALLS:  Has patient  fallen in last 6 months? 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   PLOF: Independent   PATIENT GOALS: reduce pain with sitting and going back to gym   PERTINENT HISTORY:  Fibroid   BOWEL MOVEMENT: Pain with bowel movement: No Type of bowel movement:Type (Bristol Stool Scale) type 4, Frequency every 1-2 days, Strain No, and Splinting no Fully empty rectum: Yes:     URINATION: Pain with urination: No Fully empty bladder: Yes:   Stream: Strong Urgency: Urgency Frequency: average Leakage: Sneezing, when walking to the bathroom after she has the urgency   INTERCOURSE: Has not had intercourse due to just having a vaginal birth   PREGNANCY: Vaginal deliveries 2 Tearing Yes: third degree Currently pregnant No         OBJECTIVE:    PATIENT SURVEYS:  POPIQ-7 33   COGNITION: Overall cognitive status: Within functional limits for tasks assessed                          SENSATION: Light touch: Appears intact Proprioception: Appears intact       POSTURE: decreased lumbar lordosis   PELVIC ALIGNMENT:   LUMBARAROM/PROM:   A/PROM A/PROM  eval 10/02/22  Flexion Decreased by 25% with tightness in the lumbar Decreased by 25%   Extension Decreased by 50% with tailbone pain Decreased by 25%   Left rotation Decreased by 25% Decreased by 25%    (Blank rows = not tested)   LOWER EXTREMITY ROM: bilateral hip ROM is full     LOWER EXTREMITY MMT:   MMT Right eval Left eval  Hip extension 3/5 5/5  Hip abduction 4/5 4/5  Hip  adduction 5/5 5/5    PALPATION:   General  decreased movement of the L1-L5, sacral movement hurts, tenderness on the sides of the sacrum and coccygeus; decreased SI joint movement                 External Perineal Exam Not assessed yet                             Internal Pelvic Floor not assessed yet   Patient confirms identification and approves PT to assess internal pelvic floor and treatment No due to her not having her 6 week visit with her doctor yet.    PELVIC MMT:   MMT eval 09/26/22  Vaginal   0/5  Internal Anal Sphincter      External Anal Sphincter      Puborectalis      Diastasis Recti 2 finger width with good tension    (Blank rows = not tested)         TONE: Not assessed   PROLAPSE: Not assessed   TODAY'S TREATMENT:  10/02/22 Manual: Soft tissue mobilization: To asssess for dry needling Manual work to bilateral lumbar paraspinals and left gluteus medius Scar tissue mobilization: Myofascial release: Spinal mobilization: PA and rotational mobilization to L1-L5 Anterior mobilization to L5 with prone press up Sacral mobilization to right SI joint Internal pelvic floor techniques: Trigger Point Dry-Needling  Treatment instructions: Expect mild to moderate muscle soreness. S/S of pneumothorax if dry needled over a lung field, and to seek immediate medical attention should they occur. Patient verbalized understanding of these instructions and education.  Patient Consent Given: Yes Education handout provided: Yes  Muscles treated: lumbar multifidi, left gluteus medius Electrical stimulation performed: No Parameters: N/A Treatment response/outcome: elongation of muscle and trigger point response Exercises: Stretches/mobility: Pigeon pose to bil. Holding 30 sec Strengthening: Supine march with abdominals engagement Supine knee fall out with abdominals engagement Quadruped lift leg with abdominal engagement    09/26/22 Manual: Internal pelvic floor  techniques: No emotional/communication barriers or cognitive limitation. Patient is motivated to learn. Patient understands and agrees with treatment goals and plan. PT explains patient will be examined in standing, sitting, and lying down to see how their muscles and joints work. When they are ready, they will be asked to remove their underwear so PT can examine their perineum. The patient is also given the option of providing their own chaperone as one is not provided in our facility. The patient also has the right and is explained the right to defer or refuse any part of the evaluation or treatment including the internal exam. With the patient's consent, PT will use one gloved finger to gently assess the muscles of the pelvic floor, seeing how well it contracts and relaxes and if there is muscle symmetry. After, the patient will get dressed and PT and patient will discuss exam findings and plan of care. PT and patient discuss plan of care, schedule, attendance policy and HEP activities.  Manual work around the perineal body working on the third degree tear that is very sensitive so had to go very slowly.  Manual work to the introitus, and along the posterior fourchette Patient had to go to the bathroom to urinate and leg out gas during the manual work so did not have as long of treatment time Educated patient on how to perform manual work to the perineal body to work on the scar     09/10/22 Manual: Soft tissue mobilization: Manual work to bilateral levator ani in supine and hip adductors Educated patient on how to do manual work on the [employer] and gluteals in sidely Exercises: Stretches/mobility: Piriformis stretch in supine bil. Holding 30 sec Cat cow Happy baby with diaphragmatic breathing Strength Quadruped hip extension Supine marching                                                                                                                                 . PATIENT EDUCATION:   10/02/22 Education details: Access Code: [email or web address] Person educated: Patient Education method: Explanation, Demonstration, Tactile cues, Verbal cues, and Handouts Education comprehension: verbalized understanding, returned demonstration, verbal cues required, tactile cues required, and needs further education   HOME EXERCISE PROGRAM: 10/02/22 Access Code: [email or web address] URL: [email or web address] Date: 10/02/2022 Prepared by: Earlie Counts  Program Notes massage along the gluteal fold as you lay on your side for 3 minutes each side.   Exercises - - Bent Knee Fallouts  - 1 x daily - 3 x weekly - 2 sets - 10 reps - Supine March  - 1 x daily - 3  x weekly - 2 sets - 10 reps - Quadruped Leg Lifts  - 1 x daily - 73 x weekly - 2 sets - 10 reps  Patient Education -  Trigger Point Dry Needling   ASSESSMENT:   CLINICAL IMPRESSION: Patient is a 32 y.o. female who was seen today for physical therapy  treatment for Third degree perineal laceration . Patient has increased spinal mobility that will help with movement of the tailbone to reduce pain. Patient is learning how to engage  abdominals with exercises. Patient able to sit up straight after treatment without tailbone pain. Pelvic floor strength is 0/5. She will benefit from skilled therapy to reduce pain with sitting and assist her with increased strength to take care of her children.    OBJECTIVE IMPAIRMENTS: decreased activity tolerance, decreased coordination, decreased endurance, decreased mobility, decreased strength, increased fascial restrictions, increased muscle spasms, and pain.    ACTIVITY LIMITATIONS: sitting and caring for others   PARTICIPATION LIMITATIONS: occupation   PERSONAL FACTORS: Third degree perineal laceration  are also affecting patient's functional outcome.    REHAB POTENTIAL: Excellent   CLINICAL DECISION MAKING: Stable/uncomplicated   EVALUATION COMPLEXITY: Low     GOALS: Goals reviewed with  patient? Yes   SHORT TERM GOALS: Target date: 09/27/22   Patient is independent with initial HEP for core engagement.  Baseline: not educated yet Goal status: Met 10/02/22   2.  Patient educated on perineal scar massage to realign the scar.  Baseline: not educated yet Goal status: Met 09/26/22     LONG TERM GOALS: Target date: 11/22/22   Patient independent with HEP for core and pelvic floor to reduce leakage and assist her in returning to the gym Baseline: not educated yet Goal status: INITIAL   2.  Patient able to sit for 1 hour with no pain due to improved mobility of pelvis and reduction of trigger points.  Baseline: not able to sit without pain level being 6/10.  Goal status: INITIAL   3.  Patient is able to sneeze without leaking urine due to improved mobility of perineal scar mobility.  Baseline: sneeze will leak urine Goal status: INITIAL     PLAN:   PT FREQUENCY: 1x/week   PT DURATION: 12 weeks   PLANNED INTERVENTIONS: Therapeutic exercises, Therapeutic activity, Neuromuscular re-education, Patient/Family education, Joint mobilization, Dry Needling, Electrical stimulation, Spinal mobilization, Cryotherapy, Moist heat, scar mobilization, Biofeedback, and Manual therapy   PLAN FOR NEXT SESSION:  advanced core strength for the gym, work on the perineal body, pelvic floor muscles  Earlie Counts, PT 10/02/22 2:55 PM

## 2022-10-08 ENCOUNTER — Encounter: Payer: Medicaid Other | Admitting: Physical Therapy

## 2022-10-10 ENCOUNTER — Ambulatory Visit: Payer: Medicaid Other | Attending: Obstetrics and Gynecology | Admitting: Physical Therapy

## 2022-10-10 ENCOUNTER — Encounter: Payer: Self-pay | Admitting: Physical Therapy

## 2022-10-10 ENCOUNTER — Ambulatory Visit: Payer: Medicaid Other | Admitting: Physical Therapy

## 2022-10-10 DIAGNOSIS — Z3A00 Weeks of gestation of pregnancy not specified: Secondary | ICD-10-CM | POA: Diagnosis not present

## 2022-10-10 DIAGNOSIS — R102 Pelvic and perineal pain: Secondary | ICD-10-CM | POA: Insufficient documentation

## 2022-10-10 DIAGNOSIS — M6281 Muscle weakness (generalized): Secondary | ICD-10-CM | POA: Diagnosis not present

## 2022-10-10 NOTE — Therapy (Signed)
OUTPATIENT PHYSICAL THERAPY TREATMENT NOTE   Patient Name: Heather Brock MRN: [id number] DOB:[birth year], 32 y.o., female Today's Date: 10/10/2022  PCP: None REFERRING PROVIDER: Aletha Halim, MD   END OF SESSION:   PT End of Session - 10/10/22 1528     Visit Number 5    Date for PT Re-Evaluation 11/22/22    Authorization Type [employer]    Authorization Time Period 08/30/2022-10/29/2022    Authorization - Visit Number 4    Authorization - Number of Visits 4    PT Start Time [id number]    PT Stop Time 1610    PT Time Calculation (min) 40 min    Activity Tolerance Patient tolerated treatment well    Behavior During Therapy [facility] for tasks assessed/performed             Past Medical History:  Diagnosis Date   Chlamydia    Depression    when younger, ok now   Fibroid    Fibroid uterus 09/21/2020   8/1: 63%, 1946gm, ac 73%, afi 15.   5cm IM anterior on 5/27. No comment on 8/1 u/s. Rpt u/s prn    Gestational diabetes 07/29/2022   Gonorrhea    Trichomonas exposure    Vaginal Pap smear, abnormal    Past Surgical History:  Procedure Laterality Date   NO PAST SURGERIES     PERINEAL LACERATION REPAIR N/A 07/29/2022   Procedure: SUTURE REPAIR PERINEAL LACERATION;  Surgeon: Aletha Halim, MD;  Location: [facility] LD ORS;  Service: Gynecology;  Laterality: N/A;   THERAPEUTIC ABORTION     WISDOM TOOTH EXTRACTION     Patient Active Problem List   Diagnosis Date Noted   Type 3c perineal laceration 07/30/2022   LGSIL on Pap smear of cervix 02/27/2021   REFERRING DIAG: O70.20 (ICD-10-CM) - Third degree perineal laceration    THERAPY DIAG:  Muscle weakness (generalized)   Perineal pain in female   Rationale for Evaluation and Treatment: Rehabilitation   ONSET DATE: 07/30/23   SUBJECTIVE:                                                                                                                                                                                            SUBJECTIVE  STATEMENT:  I just got done with my cycle yesterday.      PAIN:  Are you having pain? Yes NPRS scale: 5/10, pain will go away Pain location:  tailbone   Pain type: pressure Pain description: intermittent    Aggravating factors: sitting Relieving factors: stand, sitting on pillow, sit up straight   PRECAUTIONS: None   WEIGHT BEARING RESTRICTIONS: No  FALLS:  Has patient fallen in last 6 months? 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   PLOF: Independent   PATIENT GOALS: reduce pain with sitting and going back to gym   PERTINENT HISTORY:  Fibroid   BOWEL MOVEMENT: Pain with bowel movement: No Type of bowel movement:Type (Bristol Stool Scale) type 4, Frequency every 1-2 days, Strain No, and Splinting no Fully empty rectum: Yes:     URINATION: Pain with urination: No Fully empty bladder: Yes:   Stream: Strong Urgency: Urgency Frequency: average Leakage: Sneezing, when walking to the bathroom after she has the urgency   INTERCOURSE: Has not had intercourse due to just having a vaginal birth   PREGNANCY: Vaginal deliveries 2 Tearing Yes: third degree Currently pregnant No         OBJECTIVE:    PATIENT SURVEYS:  POPIQ-7 33 10/10/22 POPIQ-7 19   COGNITION: Overall cognitive status: Within functional limits for tasks assessed                          SENSATION: Light touch: Appears intact Proprioception: Appears intact       POSTURE: decreased lumbar lordosis   PELVIC ALIGNMENT:   LUMBARAROM/PROM:   A/PROM A/PROM  eval 10/02/22 10/10/22  Flexion Decreased by 25% with tightness in the lumbar Decreased by 25%  fullf  Extension Decreased by 50% with tailbone pain Decreased by 25%  Decreased by 25%  Left rotation Decreased by 25% Decreased by 25%  full   (Blank rows = not tested)   LOWER EXTREMITY ROM: bilateral hip ROM is full     LOWER EXTREMITY MMT:   MMT  Right eval Left eval Right 10/10/22 Left  10/10/22  Hip extension 3/5 5/5 4/5 4/5  Hip abduction 4/5 4/5 4+/5 4/5  Hip adduction 5/5 5/5 5/5 5/5    PALPATION:   General  decreased movement of the L1-L5, sacral movement hurts, tenderness on the sides of the sacrum and coccygeus; decreased SI joint movement                 External Perineal Exam Not assessed yet                             Internal Pelvic Floor not assessed yet   Patient confirms identification and approves PT to assess internal pelvic floor and treatment No due to her not having her 6 week visit with her doctor yet.    PELVIC MMT:   MMT eval 09/26/22 10/10/22  Vaginal   0/5 1/5  Internal Anal Sphincter       External Anal Sphincter       Puborectalis       Diastasis Recti 2 finger width with good tension     (Blank rows = not tested)         TONE: Not assessed   PROLAPSE: Not assessed   TODAY'S TREATMENT:  10/10/22 Manual: Soft tissue mobilization: To assess for dry needling Manual work to lumbar paraspinals, gluteal, and coccygeus Spinal mobilization: PA mobilization of L1-L5 Internal pelvic floor techniques: No emotional/communication barriers or cognitive limitation. Patient is motivated to learn. Patient understands and agrees with treatment goals and plan. PT explains patient will be examined in standing, sitting, and lying down to see how their muscles and joints work. When  they are ready, they will be asked to remove their underwear so PT can examine their perineum. The patient is also given the option of providing their own chaperone as one is not provided in our facility. The patient also has the right and is explained the right to defer or refuse any part of the evaluation or treatment including the internal exam. With the patient's consent, PT will use one gloved finger to gently assess the muscles of the pelvic floor, seeing how well it contracts and relaxes and if there is muscle symmetry. After, the  patient will get dressed and PT and patient will discuss exam findings and plan of care. PT and patient discuss plan of care, schedule, attendance policy and HEP activities. Manual work on the perineal body and other finger on the posterior vaginal canal working on the tissue and releasing the restrictions Trigger Point Dry-Needling  Treatment instructions: Expect mild to moderate muscle soreness. S/S of pneumothorax if dry needled over a lung field, and to seek immediate medical attention should they occur. Patient verbalized understanding of these instructions and education.  Patient Consent Given: Yes Education handout provided: Previously provided Muscles treated: left multifidi lumbar, left gluteus medius, left gluteus maximus, bilateral coccygeus Electrical stimulation performed: No Parameters: N/A Treatment response/outcome: elongation of muscle and trigger point response  Neuromuscular re-education: Pelvic floor contraction training: Therapist finger in the vaginal canal and place the other hand on the lower abdomen to work on pelvic floor contraction with lift instead of bearing down.  Exercises: Strengthening: Nustep level 5 for 6 minutes while assessing the patient    10/02/22 Manual: Soft tissue mobilization: To asssess for dry needling Manual work to bilateral lumbar paraspinals and left gluteus medius Spinal mobilization: PA and rotational mobilization to L1-L5 Anterior mobilization to L5 with prone press up Sacral mobilization to right SI joint Internal pelvic floor techniques: Trigger Point Dry-Needling  Treatment instructions: Expect mild to moderate muscle soreness. S/S of pneumothorax if dry needled over a lung field, and to seek immediate medical attention should they occur. Patient verbalized understanding of these instructions and education.   Patient Consent Given: Yes Education handout provided: Yes Muscles treated: lumbar multifidi, left gluteus  medius Electrical stimulation performed: No Parameters: N/A Treatment response/outcome: elongation of muscle and trigger point response Exercises: Stretches/mobility: Pigeon pose to bil. Holding 30 sec Strengthening: Supine march with abdominals engagement Supine knee fall out with abdominals engagement Quadruped lift leg with abdominal engagement     09/26/22 Manual: Internal pelvic floor techniques: No emotional/communication barriers or cognitive limitation. Patient is motivated to learn. Patient understands and agrees with treatment goals and plan. PT explains patient will be examined in standing, sitting, and lying down to see how their muscles and joints work. When they are ready, they will be asked to remove their underwear so PT can examine their perineum. The patient is also given the option of providing their own chaperone as one is not provided in our facility. The patient also has the right and is explained the right to defer or refuse any part of the evaluation or treatment including the internal exam. With the patient's consent, PT will use one gloved finger to gently assess the muscles of the pelvic floor, seeing how well it contracts and relaxes and if there is muscle symmetry. After, the patient will get dressed and PT and patient will discuss exam findings and plan of care. PT and patient discuss plan of care, schedule, attendance policy and HEP activities.  Manual work around the perineal body working on the third degree tear that is very sensitive so had to go very slowly.  Manual work to the introitus, and along the posterior fourchette Patient had to go to the bathroom to urinate and leg out gas during the manual work so did not have as long of treatment time Educated patient on how to perform manual work to the perineal body to work on the scar     09/10/22 Manual: Soft tissue mobilization: Manual work to bilateral levator ani in supine and hip adductors Educated  patient on how to do manual work on the [employer] and gluteals in sidely Exercises: Stretches/mobility: Piriformis stretch in supine bil. Holding 30 sec Cat cow Happy baby with diaphragmatic breathing Strength Quadruped hip extension Supine marching                                                                                                                                 . PATIENT EDUCATION:  10/02/22 Education details: Access Code: [email or web address] Person educated: Patient Education method: Explanation, Demonstration, Tactile cues, Verbal cues, and Handouts Education comprehension: verbalized understanding, returned demonstration, verbal cues required, tactile cues required, and needs further education   HOME EXERCISE PROGRAM: 10/02/22 Access Code: [email or web address] URL: [email or web address] Date: 10/02/2022 Prepared by: Earlie Counts   Program Notes massage along the gluteal fold as you lay on your side for 3 minutes each side.    Exercises - - Bent Knee Fallouts  - 1 x daily - 3 x weekly - 2 sets - 10 reps - Supine March  - 1 x daily - 3 x weekly - 2 sets - 10 reps - Quadruped Leg Lifts  - 1 x daily - 73 x weekly - 2 sets - 10 reps   Patient Education -  Trigger Point Dry Needling     ASSESSMENT:   CLINICAL IMPRESSION: Patient is a 32 y.o. female who was seen today for physical therapy  treatment for Third degree perineal laceration .  POPIQ-7 went from 33 to 19.  Patient has increased in bilateral hip strength. Patient has tenderness along the perineal body and restrictions from the scar. Pelvic floor strength is now 1/5 but needs tactile and verbal cues to not bear down. Patient has less coccyx pain after the dry needling. Coccyx pain decreased from 6/10 to 3/10. She has increased in lumbar extension with manual mobilization. Patient is not leaking urine with sneezes. She will benefit from skilled therapy to reduce pain with sitting and assist her with increased strength  to take care of her children.    OBJECTIVE IMPAIRMENTS: decreased activity tolerance, decreased coordination, decreased endurance, decreased mobility, decreased strength, increased fascial restrictions, increased muscle spasms, and pain.    ACTIVITY LIMITATIONS: sitting and caring for others   PARTICIPATION LIMITATIONS: occupation   PERSONAL FACTORS: Third degree perineal laceration  are also affecting patient's functional outcome.    REHAB POTENTIAL: Excellent  CLINICAL DECISION MAKING: Stable/uncomplicated   EVALUATION COMPLEXITY: Low     GOALS: Goals reviewed with patient? Yes   SHORT TERM GOALS: Target date: 09/27/22   Patient is independent with initial HEP for core engagement.  Baseline: not educated yet Goal status: Met 10/02/22   2.  Patient educated on perineal scar massage to realign the scar.  Baseline: not educated yet Goal status: Met 09/26/22     LONG TERM GOALS: Target date: 11/22/22   Patient independent with HEP for core and pelvic floor to reduce leakage and assist her in returning to the gym Baseline: not educated yet Goal status: ongoing 10/10/22   2.  Patient able to sit for 1 hour with no pain due to improved mobility of pelvis and reduction of trigger points.  Baseline: not able to sit without pain level being 6/10.  Goal status: ongoing 10/10/22   3.  Patient is able to sneeze without leaking urine due to improved mobility of perineal scar mobility.  Baseline: sneeze will leak urine Goal status: ongoing 10/10/22     PLAN:   PT FREQUENCY: 1x/week   PT DURATION: 8 weeks   PLANNED INTERVENTIONS: Therapeutic exercises, Therapeutic activity, Neuromuscular re-education, Patient/Family education, Joint mobilization, Dry Needling, Electrical stimulation, Spinal mobilization, Cryotherapy, Moist heat, scar mobilization, Biofeedback, and Manual therapy   PLAN FOR NEXT SESSION:  advanced core strength for the gym, work on the perineal body, pelvic floor  muscles, dry needling to  coccygeus  Earlie Counts, PT 10/10/22 3:30 PM

## 2022-10-15 ENCOUNTER — Ambulatory Visit: Payer: Medicaid Other | Admitting: Physical Therapy

## 2022-10-17 ENCOUNTER — Encounter: Payer: Self-pay | Admitting: Physical Therapy

## 2022-10-17 ENCOUNTER — Ambulatory Visit: Payer: Medicaid Other | Admitting: Physical Therapy

## 2022-10-17 DIAGNOSIS — R102 Pelvic and perineal pain: Secondary | ICD-10-CM

## 2022-10-17 DIAGNOSIS — M6281 Muscle weakness (generalized): Secondary | ICD-10-CM

## 2022-10-17 NOTE — Therapy (Signed)
OUTPATIENT PHYSICAL THERAPY TREATMENT NOTE   Patient Name: Heather Brock MRN: [id number] DOB:March 15, 1991, 32 y.o., female Today's Date: 10/17/2022  PCP: None REFERRING PROVIDER:  Aletha Halim, MD    END OF SESSION:   PT End of Session - 10/17/22 1106     Visit Number 6    Date for PT Re-Evaluation 11/22/22    Authorization Type [employer]    Authorization Time Period 08/30/2022-12/28/2022    Authorization - Visit Number 5    Authorization - Number of Visits 8    PT Start Time 1100    PT Stop Time 1140    PT Time Calculation (min) 40 min    Activity Tolerance Patient tolerated treatment well    Behavior During Therapy WFL for tasks assessed/performed             Past Medical History:  Diagnosis Date   Chlamydia    Depression    when younger, ok now   Fibroid    Fibroid uterus 09/21/2020   8/1: 63%, 1946gm, ac 73%, afi 15.   5cm IM anterior on 5/27. No comment on 8/1 u/s. Rpt u/s prn    Gestational diabetes 07/29/2022   Gonorrhea    Trichomonas exposure    Vaginal Pap smear, abnormal    Past Surgical History:  Procedure Laterality Date   NO PAST SURGERIES     PERINEAL LACERATION REPAIR N/A 07/29/2022   Procedure: SUTURE REPAIR PERINEAL LACERATION;  Surgeon: Aletha Halim, MD;  Location: [facility];  Service: Gynecology;  Laterality: N/A;   THERAPEUTIC ABORTION     WISDOM TOOTH EXTRACTION     Patient Active Problem List   Diagnosis Date Noted   Type 3c perineal laceration 07/30/2022   LGSIL on Pap smear of cervix 02/27/2021   REFERRING DIAG: O70.20 (ICD-10-CM) - Third degree perineal laceration    THERAPY DIAG:  Muscle weakness (generalized)   Perineal pain in female   Rationale for Evaluation and Treatment: Rehabilitation   ONSET DATE: 07/30/23   SUBJECTIVE:                                                                                                                                                                                            SUBJECTIVE  STATEMENT: No back pain. I had some pain when just sat down on the bike. Sat from 12-5 with no urinary leakage.      PAIN:  Are you having pain? Yes NPRS scale: 2/10, pain will go away Pain location:  tailbone   Pain type: pressure Pain description: intermittent    Aggravating factors: sitting Relieving factors: stand, sitting on pillow,  sit up straight   PRECAUTIONS: None   WEIGHT BEARING RESTRICTIONS: No   FALLS:  Has patient fallen in last 6 months? 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   PLOF: Independent   PATIENT GOALS: reduce pain with sitting and going back to gym   PERTINENT HISTORY:  Fibroid   BOWEL MOVEMENT: Pain with bowel movement: No Type of bowel movement:Type (Bristol Stool Scale) type 4, Frequency every 1-2 days, Strain No, and Splinting no Fully empty rectum: Yes:     URINATION: Pain with urination: No Fully empty bladder: Yes:   Stream: Strong Urgency: Urgency Frequency: average Leakage: Sneezing when bladder is full, when walking to the bathroom after she has the urgency   INTERCOURSE: Has not had intercourse due to just having a vaginal birth   PREGNANCY: Vaginal deliveries 2 Tearing Yes: third degree Currently pregnant No         OBJECTIVE:    PATIENT SURVEYS:  POPIQ-7 33 10/10/22 POPIQ-7 19   COGNITION: Overall cognitive status: Within functional limits for tasks assessed                          SENSATION: Light touch: Appears intact Proprioception: Appears intact       POSTURE: decreased lumbar lordosis   PELVIC ALIGNMENT:   LUMBARAROM/PROM:   A/PROM A/PROM  eval 10/02/22 10/10/22  Flexion Decreased by 25% with tightness in the lumbar Decreased by 25%  fullf  Extension Decreased by 50% with tailbone pain Decreased by 25%  Decreased by 25%  Left rotation Decreased by 25% Decreased by 25%  full   (Blank rows = not tested)    LOWER EXTREMITY ROM: bilateral hip ROM is full     LOWER EXTREMITY MMT:   MMT Right eval Left eval Right 10/10/22 Left  10/10/22  Hip extension 3/5 5/5 4/5 4/5  Hip abduction 4/5 4/5 4+/5 4/5  Hip adduction 5/5 5/5 5/5 5/5    PALPATION:   General  decreased movement of the L1-L5, sacral movement hurts, tenderness on the sides of the sacrum and coccygeus; decreased SI joint movement                 External Perineal Exam Not assessed yet                             Internal Pelvic Floor not assessed yet   Patient confirms identification and approves PT to assess internal pelvic floor and treatment No due to her not having her 6 week visit with her doctor yet.    PELVIC MMT:   MMT eval 09/26/22 10/10/22 10/17/22  Vaginal   0/5 1/5 2/5  Internal Anal Sphincter         External Anal Sphincter         Puborectalis         Diastasis Recti 2 finger width with good tension       (Blank rows = not tested)         TONE: Not assessed   PROLAPSE: Not assessed   TODAY'S TREATMENT:  10/17/22 Manual: Soft tissue mobilization: To assess for dry needling Manual work to the piriformis, left coccygeus and lumbar paraspinals after dry needling.  Internal pelvic floor techniques: No emotional/communication barriers or cognitive limitation. Patient is motivated to learn. Patient  understands and agrees with treatment goals and plan. PT explains patient will be examined in standing, sitting, and lying down to see how their muscles and joints work. When they are ready, they will be asked to remove their underwear so PT can examine their perineum. The patient is also given the option of providing their own chaperone as one is not provided in our facility. The patient also has the right and is explained the right to defer or refuse any part of the evaluation or treatment including the internal exam. With the patient's consent, PT will use one gloved finger to gently assess the muscles of the pelvic  floor, seeing how well it contracts and relaxes and if there is muscle symmetry. After, the patient will get dressed and PT and patient will discuss exam findings and plan of care. PT and patient discuss plan of care, schedule, attendance policy and HEP activities.  Going through the vaginal working on the posterior vaginal canal and along the perineal body Manual work on the perineal body to elongate after dry needling Trigger Point Dry-Needling  Treatment instructions: Expect mild to moderate muscle soreness. S/S of pneumothorax if dry needled over a lung field, and to seek immediate medical attention should they occur. Patient verbalized understanding of these instructions and education.  Patient Consent Given: Yes Education handout provided: Previously provided Muscles treated: lumbar multifidi; piriformis, left coccygeus, perineal body Electrical stimulation performed: No Parameters: N/A Treatment response/outcome: elongation of muscles and trigger point response Exercises: Strengthening: Recumbent bike for 5 minutes level 1 while assessing patient Pelvic floor contraction with tactile cues and engagement of the lower abdomen at the same time   10/10/22 Manual: Soft tissue mobilization: To assess for dry needling Manual work to lumbar paraspinals, gluteal, and coccygeus Spinal mobilization: PA mobilization of L1-L5 Internal pelvic floor techniques: No emotional/communication barriers or cognitive limitation. Patient is motivated to learn. Patient understands and agrees with treatment goals and plan. PT explains patient will be examined in standing, sitting, and lying down to see how their muscles and joints work. When they are ready, they will be asked to remove their underwear so PT can examine their perineum. The patient is also given the option of providing their own chaperone as one is not provided in our facility. The patient also has the right and is explained the right to defer or  refuse any part of the evaluation or treatment including the internal exam. With the patient's consent, PT will use one gloved finger to gently assess the muscles of the pelvic floor, seeing how well it contracts and relaxes and if there is muscle symmetry. After, the patient will get dressed and PT and patient will discuss exam findings and plan of care. PT and patient discuss plan of care, schedule, attendance policy and HEP activities. Manual work on the perineal body and other finger on the posterior vaginal canal working on the tissue and releasing the restrictions Trigger Point Dry-Needling  Treatment instructions: Expect mild to moderate muscle soreness. S/S of pneumothorax if dry needled over a lung field, and to seek immediate medical attention should they occur. Patient verbalized understanding of these instructions and education.   Patient Consent Given: Yes Education handout provided: Previously provided Muscles treated: left multifidi lumbar, left gluteus medius, left gluteus maximus, bilateral coccygeus Electrical stimulation performed: No Parameters: N/A Treatment response/outcome: elongation of muscle and trigger point response   Neuromuscular re-education: Pelvic floor contraction training: Therapist finger in the vaginal canal and place the other  hand on the lower abdomen to work on pelvic floor contraction with lift instead of bearing down.  Exercises: Strengthening: Nustep level 5 for 6 minutes while assessing the patient    10/02/22 Manual: Soft tissue mobilization: To asssess for dry needling Manual work to bilateral lumbar paraspinals and left gluteus medius Spinal mobilization: PA and rotational mobilization to L1-L5 Anterior mobilization to L5 with prone press up Sacral mobilization to right SI joint Internal pelvic floor techniques: Trigger Point Dry-Needling  Treatment instructions: Expect mild to moderate muscle soreness. S/S of pneumothorax if dry needled  over a lung field, and to seek immediate medical attention should they occur. Patient verbalized understanding of these instructions and education.   Patient Consent Given: Yes Education handout provided: Yes Muscles treated: lumbar multifidi, left gluteus medius Electrical stimulation performed: No Parameters: N/A Treatment response/outcome: elongation of muscle and trigger point response Exercises: Stretches/mobility: Pigeon pose to bil. Holding 30 sec Strengthening: Supine march with abdominals engagement Supine knee fall out with abdominals engagement Quadruped lift leg with abdominal engagement       PATIENT EDUCATION:  10/02/22 Education details: Access Code: [email or web address] Person educated: Patient Education method: Explanation, Demonstration, Tactile cues, Verbal cues, and Handouts Education comprehension: verbalized understanding, returned demonstration, verbal cues required, tactile cues required, and needs further education   HOME EXERCISE PROGRAM: 10/02/22 Access Code: [email or web address] URL: [email or web address] Date: 10/02/2022 Prepared by: Earlie Counts   Program Notes massage along the gluteal fold as you lay on your side for 3 minutes each side.    Exercises - - Bent Knee Fallouts  - 1 x daily - 3 x weekly - 2 sets - 10 reps - Supine March  - 1 x daily - 3 x weekly - 2 sets - 10 reps - Quadruped Leg Lifts  - 1 x daily - 73 x weekly - 2 sets - 10 reps   Patient Education -  Trigger Point Dry Needling     ASSESSMENT:   CLINICAL IMPRESSION: Patient is a 32 y.o. female who was seen today for physical therapy  treatment for Third degree perineal laceration .   Pelvic floor strength is 2/5 now. She has less tenderness along the perineal body. She is able to wear different underwear due to the reduction of the coccyx pain. She will benefit from skilled therapy to reduce pain with sitting and assist her with increased strength to take care of her children.     OBJECTIVE IMPAIRMENTS: decreased activity tolerance, decreased coordination, decreased endurance, decreased mobility, decreased strength, increased fascial restrictions, increased muscle spasms, and pain.    ACTIVITY LIMITATIONS: sitting and caring for others   PARTICIPATION LIMITATIONS: occupation   PERSONAL FACTORS: Third degree perineal laceration  are also affecting patient's functional outcome.    REHAB POTENTIAL: Excellent   CLINICAL DECISION MAKING: Stable/uncomplicated   EVALUATION COMPLEXITY: Low     GOALS: Goals reviewed with patient? Yes   SHORT TERM GOALS: Target date: 09/27/22   Patient is independent with initial HEP for core engagement.  Baseline: not educated yet Goal status: Met 10/02/22   2.  Patient educated on perineal scar massage to realign the scar.  Baseline: not educated yet Goal status: Met 09/26/22     LONG TERM GOALS: Target date: 11/22/22   Patient independent with HEP for core and pelvic floor to reduce leakage and assist her in returning to the gym Baseline: not educated yet Goal status: ongoing 10/10/22   2.  Patient able to sit for 1 hour with  no pain due to improved mobility of pelvis and reduction of trigger points.  Baseline: not able to sit without pain level being 6/10.  Goal status: ongoing 10/10/22   3.  Patient is able to sneeze without leaking urine due to improved mobility of perineal scar mobility.  Baseline: sneeze will leak urine Goal status: ongoing 10/10/22     PLAN:   PT FREQUENCY: 1x/week   PT DURATION: 8 weeks   PLANNED INTERVENTIONS: Therapeutic exercises, Therapeutic activity, Neuromuscular re-education, Patient/Family education, Joint mobilization, Dry Needling, Electrical stimulation, Spinal mobilization, Cryotherapy, Moist heat, scar mobilization, Biofeedback, and Manual therapy   PLAN FOR NEXT SESSION:  advanced core strength for the gym, work on the perineal body, pelvic floor muscles, dry needling   Earlie Counts,  PT 10/17/22 3:04 PM

## 2022-10-22 ENCOUNTER — Ambulatory Visit: Payer: Medicaid Other | Admitting: Physical Therapy

## 2022-10-22 ENCOUNTER — Encounter: Payer: Self-pay | Admitting: Physical Therapy

## 2022-10-22 DIAGNOSIS — R102 Pelvic and perineal pain: Secondary | ICD-10-CM

## 2022-10-22 DIAGNOSIS — M6281 Muscle weakness (generalized): Secondary | ICD-10-CM

## 2022-10-22 NOTE — Therapy (Signed)
OUTPATIENT PHYSICAL THERAPY TREATMENT NOTE   Patient Name: Heather Brock MRN: [id number] DOB:10/23/1990, 32 y.o., female Today's Date: 10/22/2022  PCP: None  REFERRING PROVIDER: Aletha Halim, MD   END OF SESSION:   PT End of Session - 10/22/22 1236     Visit Number 7    Date for PT Re-Evaluation 11/22/22    Authorization Type [employer]    Authorization Time Period 08/30/2022-12/28/2022    Authorization - Visit Number 6    Authorization - Number of Visits 8    PT Start Time [id number]    PT Stop Time 1313    PT Time Calculation (min) 38 min    Activity Tolerance Patient tolerated treatment well    Behavior During Therapy WFL for tasks assessed/performed             Past Medical History:  Diagnosis Date   Chlamydia    Depression    when younger, ok now   Fibroid    Fibroid uterus 09/21/2020   8/1: 63%, 1946gm, ac 73%, afi 15.   5cm IM anterior on 5/27. No comment on 8/1 u/s. 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 Yesterday I walked and had pain along the sacrum.    PAIN:  Are you having pain? Yes NPRS scale: 5/10, pain will go away Pain location:  tailbone/sacral area   Pain type: pressure Pain description: intermittent    Aggravating factors: sitting Relieving factors: stand, sitting on pillow, sit up straight  PRECAUTIONS: None   WEIGHT BEARING RESTRICTIONS: No   FALLS:  Has patient fallen in last 6 months? 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   PLOF: Independent   PATIENT GOALS: reduce pain with sitting and going back to gym   PERTINENT HISTORY:  Fibroid   BOWEL MOVEMENT: Pain with bowel movement: No Type of bowel movement:Type (Bristol Stool Scale) type 4, Frequency every 1-2 days, Strain No, and Splinting no Fully empty rectum: Yes:     URINATION: Pain with urination: No Fully empty bladder: Yes:   Stream: Strong Urgency: Urgency Frequency: average Leakage: Sneezing when bladder is full, when walking to the bathroom after she has the urgency   INTERCOURSE: Has not had intercourse due to just having a vaginal birth   PREGNANCY: Vaginal deliveries 2 Tearing Yes: third degree Currently pregnant No         OBJECTIVE:    PATIENT SURVEYS:  POPIQ-7 33 10/10/22 POPIQ-7 19   COGNITION: Overall cognitive status: Within functional limits for tasks assessed                          SENSATION: Light touch: Appears intact Proprioception: Appears intact       POSTURE: decreased lumbar lordosis   PELVIC ALIGNMENT:   LUMBARAROM/PROM:   A/PROM A/PROM  eval 10/02/22 10/10/22  Flexion Decreased by 25% with tightness in the lumbar Decreased by 25%  fullf  Extension Decreased by 50% with tailbone pain Decreased by 25%  Decreased by 25%  Left rotation Decreased by 25% Decreased by 25%  full   (Blank rows = not tested)   LOWER  EXTREMITY ROM: bilateral hip ROM is full     LOWER EXTREMITY MMT:   MMT Right eval Left eval Right 10/10/22 Left  10/10/22  Hip extension 3/5 5/5 4/5 4/5  Hip abduction 4/5 4/5 4+/5 4/5  Hip adduction 5/5 5/5 5/5 5/5    PALPATION:   General  decreased movement of the L1-L5, sacral movement hurts, tenderness on the sides of the sacrum and coccygeus; decreased SI joint movement                 External Perineal Exam Not assessed yet                             Internal Pelvic Floor not assessed yet   Patient confirms identification and approves PT to assess internal pelvic floor and treatment No due to her not having her 6 week visit with her doctor yet.    PELVIC MMT:   MMT eval 09/26/22 10/10/22 10/17/22  Vaginal   0/5 1/5 2/5  Internal Anal Sphincter          External Anal Sphincter          Puborectalis          Diastasis Recti 2 finger width with good tension        (Blank rows = not tested)         TONE: Not assessed   PROLAPSE: Not assessed   TODAY'S TREATMENT:  10/22/22 Manual: Soft tissue mobilization: To assess for dry needling Manual work to left gluteals and posterior hip to elongate after dry needling Manual work to the perineal body and right ischiocavernosus to elongate after dry needling Trigger Point Dry-Needling  Treatment instructions: Expect mild to moderate muscle soreness. S/S of pneumothorax if dry needled over a lung field, and to seek immediate medical attention should they occur. Patient verbalized understanding of these instructions and education.  Patient Consent Given: Yes Education handout provided: Previously provided Muscles treated: gluteus medius, gluteus medius, gluteus maximus on the left, perineal body, right ischiocavernosus Electrical stimulation performed: No Parameters: N/A Treatment response/outcome: elongation of muscle and trigger point response Exercises: Strengthening: Recumbent bike for 5 minutes level 3 while assessing  patient     10/17/22 Manual: Soft tissue mobilization: To assess for dry needling Manual work to the piriformis, left coccygeus and lumbar paraspinals after dry needling.  Internal pelvic floor techniques: No emotional/communication barriers or cognitive limitation. Patient is motivated to learn. Patient understands and agrees with treatment goals and plan. PT explains patient will be examined in standing, sitting, and lying down to see how their muscles and joints work. When they are ready, they will be asked to remove their underwear so PT can examine their perineum. The patient is also given the option of providing their own chaperone as one is not provided in our facility. The patient also has the right and is explained the right to defer or refuse any part of the evaluation or treatment including the internal exam. With the patient's consent, PT will use one gloved finger to gently assess the muscles of the pelvic floor, seeing how well it contracts and relaxes and if there is muscle symmetry. After, the patient will get dressed and PT and patient will discuss exam findings and plan of care. PT and patient discuss plan of care, schedule, attendance policy and HEP activities.  Going through the vaginal working on the posterior vaginal canal and along the perineal body Manual work on the perineal body to elongate after dry needling Trigger Point Dry-Needling  Treatment instructions: Expect mild to moderate muscle soreness. S/S of pneumothorax if dry needled over a lung field, and to seek immediate medical attention should they occur. Patient verbalized understanding of these instructions and education.   Patient Consent Given: Yes Education handout provided: Previously provided Muscles treated: lumbar multifidi; piriformis, left coccygeus, perineal body Electrical stimulation performed: No Parameters: N/A Treatment response/outcome: elongation of muscles and trigger point  response Exercises: Strengthening: Recumbent bike for 5 minutes level 1 while assessing patient Pelvic floor contraction with tactile cues and engagement of the lower abdomen at the same time     10/10/22 Manual: Soft tissue mobilization: To assess for dry needling Manual work to lumbar paraspinals, gluteal, and coccygeus Spinal mobilization: PA mobilization of L1-L5 Internal pelvic floor techniques: No emotional/communication barriers or cognitive limitation. Patient is motivated to learn. Patient understands and agrees with treatment goals and plan. PT explains patient will be examined in standing, sitting, and lying down to see how their muscles and joints work. When they are ready, they will be asked to remove their underwear so PT can examine their perineum. The patient is also given the option of providing their own chaperone as one is not provided in our facility. The patient also has the right and is explained the right to defer or refuse any part of the evaluation or treatment including the internal exam. With the patient's consent, PT will use one gloved finger to gently assess the muscles of the pelvic floor, seeing how well it contracts and relaxes and if there is muscle symmetry. After, the patient will get dressed and PT and patient will discuss exam findings and  plan of care. PT and patient discuss plan of care, schedule, attendance policy and HEP activities. Manual work on the perineal body and other finger on the posterior vaginal canal working on the tissue and releasing the restrictions Trigger Point Dry-Needling  Treatment instructions: Expect mild to moderate muscle soreness. S/S of pneumothorax if dry needled over a lung field, and to seek immediate medical attention should they occur. Patient verbalized understanding of these instructions and education.   Patient Consent Given: Yes Education handout provided: Previously provided Muscles treated: left multifidi lumbar, left  gluteus medius, left gluteus maximus, bilateral coccygeus Electrical stimulation performed: No Parameters: N/A Treatment response/outcome: elongation of muscle and trigger point response   Neuromuscular re-education: Pelvic floor contraction training: Therapist finger in the vaginal canal and place the other hand on the lower abdomen to work on pelvic floor contraction with lift instead of bearing down.  Exercises: Strengthening: Nustep level 5 for 6 minutes while assessing the patient      PATIENT EDUCATION:  10/02/22 Education details: Access Code: [email or web address] Person educated: Patient Education method: Explanation, Demonstration, Tactile cues, Verbal cues, and Handouts Education comprehension: verbalized understanding, returned demonstration, verbal cues required, tactile cues required, and needs further education   HOME EXERCISE PROGRAM: 10/02/22 Access Code: [email or web address] URL: [email or web address] Date: 10/02/2022 Prepared by: Earlie Counts   Program Notes massage along the gluteal fold as you lay on your side for 3 minutes each side.    Exercises - - Bent Knee Fallouts  - 1 x daily - 3 x weekly - 2 sets - 10 reps - Supine March  - 1 x daily - 3 x weekly - 2 sets - 10 reps - Quadruped Leg Lifts  - 1 x daily - 73 x weekly - 2 sets - 10 reps   Patient Education -  Trigger Point Dry Needling     ASSESSMENT:   CLINICAL IMPRESSION: Patient is a 32 y.o. female who was seen today for physical therapy  treatment for Third degree perineal laceration .   Patient is able to workout without pain. She has increased pain when walking a lot. She had trigger points in the left gluteal and perineal body. She continues to have not urinary leakage. She will benefit from skilled therapy to reduce pain with sitting and assist her with increased strength to take care of her children.    OBJECTIVE IMPAIRMENTS: decreased activity tolerance, decreased coordination, decreased endurance,  decreased mobility, decreased strength, increased fascial restrictions, increased muscle spasms, and pain.    ACTIVITY LIMITATIONS: sitting and caring for others   PARTICIPATION LIMITATIONS: occupation   PERSONAL FACTORS: Third degree perineal laceration  are also affecting patient's functional outcome.    REHAB POTENTIAL: Excellent   CLINICAL DECISION MAKING: Stable/uncomplicated   EVALUATION COMPLEXITY: Low     GOALS: Goals reviewed with patient? Yes   SHORT TERM GOALS: Target date: 09/27/22   Patient is independent with initial HEP for core engagement.  Baseline: not educated yet Goal status: Met 10/02/22   2.  Patient educated on perineal scar massage to realign the scar.  Baseline: not educated yet Goal status: Met 09/26/22     LONG TERM GOALS: Target date: 11/22/22   Patient independent with HEP for core and pelvic floor to reduce leakage and assist her in returning to the gym Baseline: not educated yet Goal status: ongoing 10/10/22   2.  Patient able to sit for 1 hour with no pain due to improved mobility of pelvis and reduction of  trigger points.  Baseline: not able to sit without pain level being 6/10.  Goal status: ongoing 10/10/22   3.  Patient is able to sneeze without leaking urine due to improved mobility of perineal scar mobility.  Baseline: sneeze will leak urine Goal status: ongoing 10/10/22     PLAN:   PT FREQUENCY: 1x/week   PT DURATION: 8 weeks   PLANNED INTERVENTIONS: Therapeutic exercises, Therapeutic activity, Neuromuscular re-education, Patient/Family education, Joint mobilization, Dry Needling, Electrical stimulation, Spinal mobilization, Cryotherapy, Moist heat, scar mobilization, Biofeedback, and Manual therapy   PLAN FOR NEXT SESSION:  advanced core strength for the gym, work on the perineal body, pelvic floor muscles, dry needling    Earlie Counts, PT 10/22/22 1:12 PM

## 2022-10-29 ENCOUNTER — Encounter: Payer: Self-pay | Admitting: Physical Therapy

## 2022-10-29 ENCOUNTER — Ambulatory Visit: Payer: Medicaid Other | Admitting: Physical Therapy

## 2022-10-29 DIAGNOSIS — M6281 Muscle weakness (generalized): Secondary | ICD-10-CM

## 2022-10-29 DIAGNOSIS — R102 Pelvic and perineal pain: Secondary | ICD-10-CM

## 2022-10-29 NOTE — Therapy (Signed)
OUTPATIENT PHYSICAL THERAPY TREATMENT NOTE   Patient Name: Heather Brock MRN: [id number] DOB:Apr 05, 1991, 32 y.o., female Today's Date: 10/29/2022  PCP:  None  REFERRING PROVIDER: Aletha Halim, MD   END OF SESSION:   PT End of Session - 10/29/22 1233     Visit Number 8    Date for PT Re-Evaluation 11/22/22    Authorization Type [employer]    Authorization Time Period 08/30/2022-12/28/2022    Authorization - Visit Number 7    Authorization - Number of Visits 8    PT Start Time 1230    PT Stop Time 1310    PT Time Calculation (min) 40 min    Activity Tolerance Patient tolerated treatment well    Behavior During Therapy WFL for tasks assessed/performed             Past Medical History:  Diagnosis Date   Chlamydia    Depression    when younger, ok now   Fibroid    Fibroid uterus 09/21/2020   8/1: 63%, 1946gm, ac 73%, afi 15.   5cm IM anterior on 5/27. No comment on 8/1 u/s. 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 Yesterday I walked and had pain along the sacrum.    PAIN:  Are you having pain? Yes NPRS scale: 5/10, pain will go away Pain location:  tailbone/sacral area   Pain type: pressure Pain description: intermittent    Aggravating factors: sitting Relieving factors: stand, sitting on pillow, sit up straight  PRECAUTIONS: None   WEIGHT BEARING RESTRICTIONS: No   FALLS:  Has patient fallen in last 6 months? 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   PLOF: Independent   PATIENT GOALS: reduce pain with sitting and going back to gym   PERTINENT HISTORY:  Fibroid   BOWEL MOVEMENT: Pain with bowel movement: No Type of bowel movement:Type (Bristol Stool Scale) type 4, Frequency every 1-2 days, Strain No, and Splinting no Fully empty rectum: Yes:     URINATION: Pain with urination: No Fully empty bladder: Yes:   Stream: Strong Urgency: Urgency Frequency: average Leakage: Sneezing when bladder is full, when walking to the bathroom after she has the urgency   INTERCOURSE: Has not had intercourse due to just having a vaginal birth   PREGNANCY: Vaginal deliveries 2 Tearing Yes: third degree Currently pregnant No         OBJECTIVE:    PATIENT SURVEYS:  POPIQ-7 33 10/10/22 POPIQ-7 19   COGNITION: Overall cognitive status: Within functional limits for tasks assessed                          SENSATION: Light touch: Appears intact Proprioception: Appears intact       POSTURE: decreased lumbar lordosis   PELVIC ALIGNMENT:   LUMBARAROM/PROM:   A/PROM A/PROM  eval 10/02/22 10/10/22  Flexion Decreased by 25% with tightness in the lumbar Decreased by 25%  fullf  Extension Decreased by 50% with tailbone pain Decreased by 25%  Decreased by 25%  Left rotation Decreased by 25% Decreased by 25%  full   (Blank rows = not tested)   LOWER  EXTREMITY ROM: bilateral hip ROM is full     LOWER EXTREMITY MMT:   MMT Right eval Left eval Right 10/10/22 Left  10/10/22  Hip extension 3/5 5/5 4/5 4/5  Hip abduction 4/5 4/5 4+/5 4/5  Hip adduction 5/5 5/5 5/5 5/5    PALPATION:   General  decreased movement of the L1-L5, sacral movement hurts, tenderness on the sides of the sacrum and coccygeus; decreased SI joint movement                 External Perineal Exam Not assessed yet                             Internal Pelvic Floor not assessed yet   Patient confirms identification and approves PT to assess internal pelvic floor and treatment No due to her not having her 6 week visit with her doctor yet.    PELVIC MMT:   MMT eval 09/26/22 10/10/22 10/17/22 10/29/22  Vaginal   0/5 1/5 2/5 3/5  Diastasis Recti 2 finger width with good tension         (Blank rows = not tested)         TONE: Not assessed   PROLAPSE: Not assessed   TODAY'S TREATMENT:  10/29/22 Manual: Internal pelvic floor techniques: No emotional/communication barriers or cognitive limitation. Patient is motivated to learn. Patient understands and agrees with treatment goals and plan. PT explains patient will be examined in standing, sitting, and lying down to see how their muscles and joints work. When they are ready, they will be asked to remove their underwear so PT can examine their perineum. The patient is also given the option of  providing their own chaperone as one is not provided in our facility. The patient also has the right and is explained the right to defer or refuse any part of the evaluation or treatment including the internal exam. With the patient's consent, PT will use one gloved finger to gently assess the muscles of the pelvic floor, seeing how well it contracts and relaxes and if there is muscle symmetry. After, the patient will get dressed and PT and patient will discuss exam findings and plan of care. PT and patient discuss plan of care, schedule,  attendance policy and HEP activities.  Going through the vaginal canal working on the levator ani and obturator internist to elongate the muscles Fascial release along the perineal body to work through the scar tissue Neuromuscular re-education: Pelvic floor contraction training: Pelvic floor contraction with tactile cues to draw the pelvic floor upward Pelvic floor contraction quickly with verbal cues Exercises: Strengthening: Nustep level 5 for 6 minutes while assessing patient     10/22/22 Manual: Soft tissue mobilization: To assess for dry needling Manual work to left gluteals and posterior hip to elongate after dry needling Manual work to the perineal body and right ischiocavernosus to elongate after dry needling Trigger Point Dry-Needling  Treatment instructions: Expect mild to moderate muscle soreness. S/S of pneumothorax if dry needled over a lung field, and to seek immediate medical attention should they occur. Patient verbalized understanding of these instructions and education.   Patient Consent Given: Yes Education handout provided: Previously provided Muscles treated: gluteus medius, gluteus medius, gluteus maximus on the left, perineal body, right ischiocavernosus Electrical stimulation performed: No Parameters: N/A Treatment response/outcome: elongation of muscle and trigger point response Exercises: Strengthening: Recumbent bike for 5 minutes level 3 while assessing patient      10/17/22 Manual: Soft tissue mobilization: To assess for dry needling Manual work to the piriformis, left coccygeus and lumbar paraspinals after dry needling.  Internal pelvic floor techniques: No emotional/communication barriers or cognitive limitation. Patient is motivated to learn. Patient understands and agrees with treatment goals and plan. PT explains patient will be examined in standing, sitting, and lying down to see how their muscles and joints work. When they are ready, they will  be asked to remove their underwear so PT can examine their perineum. The patient is also given the option of providing their own chaperone as one is not provided in our facility. The patient also has the right and is explained the right to defer or refuse any part of the evaluation or treatment including the internal exam. With the patient's consent, PT will use one gloved finger to gently assess the muscles of the pelvic floor, seeing how well it contracts and relaxes and if there is muscle symmetry. After, the patient will get dressed and PT and patient will discuss exam findings and plan of care. PT and patient discuss plan of care, schedule, attendance policy and HEP activities.  Going through the vaginal working on the posterior vaginal canal and along the perineal body Manual work on the perineal body to elongate after dry needling Trigger Point Dry-Needling  Treatment instructions: Expect mild to moderate muscle soreness. S/S of pneumothorax if dry needled over a lung field, and to seek immediate medical attention should they occur. Patient verbalized understanding of these instructions and education.   Patient Consent Given: Yes Education handout provided: Previously provided Muscles treated: lumbar multifidi; piriformis, left coccygeus, perineal body Electrical stimulation performed: No Parameters: N/A Treatment response/outcome: elongation of muscles and  trigger point response Exercises: Strengthening: Recumbent bike for 5 minutes level 1 while assessing patient Pelvic floor contraction with tactile cues and engagement of the lower abdomen at the same time          PATIENT EDUCATION:  10/02/22 Education details: Access Code: [email or web address] Person educated: Patient Education method: Explanation, Demonstration, Tactile cues, Verbal cues, and Handouts Education comprehension: verbalized understanding, returned demonstration, verbal cues required, tactile cues required, and needs further  education   HOME EXERCISE PROGRAM: 10/02/22 Access Code: [email or web address] URL: [email or web address] Date: 10/02/2022 Prepared by: Earlie Counts   Program Notes massage along the gluteal fold as you lay on your side for 3 minutes each side.    Exercises - - Bent Knee Fallouts  - 1 x daily - 3 x weekly - 2 sets - 10 reps - Supine March  - 1 x daily - 3 x weekly - 2 sets - 10 reps - Quadruped Leg Lifts  - 1 x daily - 73 x weekly - 2 sets - 10 reps   Patient Education -  Trigger Point Dry Needling     ASSESSMENT:   CLINICAL IMPRESSION: Patient is a 32 y.o. female who was seen today for physical therapy  treatment for Third degree perineal laceration .   Patient will leak urine when she sneezes. Pelvic floor strength after the manual work increased to 3/5. She has difficulty with quick contractions. Patient has trigger points in the levator ani and obturator internist. The perineal body is not as tender and did not need dry needling in the area.  She will benefit from skilled therapy to reduce pain with sitting and assist her with increased strength to take care of her children.    OBJECTIVE IMPAIRMENTS: decreased activity tolerance, decreased coordination, decreased endurance, decreased mobility, decreased strength, increased fascial restrictions, increased muscle spasms, and pain.    ACTIVITY LIMITATIONS: sitting and caring for others   PARTICIPATION LIMITATIONS: occupation   PERSONAL FACTORS: Third degree perineal laceration  are also affecting patient's functional outcome.    REHAB POTENTIAL: Excellent   CLINICAL DECISION MAKING: Stable/uncomplicated   EVALUATION COMPLEXITY: Low     GOALS: Goals reviewed with patient? Yes   SHORT TERM GOALS: Target date: 09/27/22   Patient is independent with initial HEP for core engagement.  Baseline: not educated yet Goal status: Met 10/02/22   2.  Patient educated on perineal scar massage to realign the scar.  Baseline: not  educated yet Goal status: Met 09/26/22     LONG TERM GOALS: Target date: 11/22/22   Patient independent with HEP for core and pelvic floor to reduce leakage and assist her in returning to the gym Baseline: not educated yet Goal status: ongoing 10/10/22   2.  Patient able to sit for 1 hour with no pain due to improved mobility of pelvis and reduction of trigger points.  Baseline: not able to sit without pain level being 6/10.  Goal status: ongoing 10/10/22   3.  Patient is able to sneeze without leaking urine due to improved mobility of perineal scar mobility.  Baseline: sneeze will leak urine Goal status: ongoing 10/10/22     PLAN:   PT FREQUENCY: 1x/week   PT DURATION: 8 weeks   PLANNED INTERVENTIONS: Therapeutic exercises, Therapeutic activity, Neuromuscular re-education, Patient/Family education, Joint mobilization, Dry Needling, Electrical stimulation, Spinal mobilization, Cryotherapy, Moist heat, scar mobilization, Biofeedback, and Manual therapy   PLAN FOR NEXT SESSION:  advanced core strength for the gym, work on the perineal body,  pelvic floor muscles, put in medicaid renewal.    Earlie Counts, PT 10/29/22 1:10 PM

## 2022-11-05 ENCOUNTER — Ambulatory Visit: Payer: Medicaid Other | Attending: Obstetrics and Gynecology | Admitting: Physical Therapy

## 2022-11-05 ENCOUNTER — Encounter: Payer: Self-pay | Admitting: Physical Therapy

## 2022-11-05 DIAGNOSIS — R102 Pelvic and perineal pain: Secondary | ICD-10-CM | POA: Insufficient documentation

## 2022-11-05 DIAGNOSIS — M6281 Muscle weakness (generalized): Secondary | ICD-10-CM | POA: Insufficient documentation

## 2022-11-05 NOTE — Therapy (Signed)
OUTPATIENT PHYSICAL THERAPY TREATMENT NOTE   Patient Name: Heather Brock MRN: [id number] DOB:1990/11/01, 32 y.o., female Today's Date: 11/05/2022  PCP:  None  REFERRING PROVIDER: Aletha Halim, MD   END OF SESSION:   PT End of Session - 11/05/22 1537     Visit Number 9    Date for PT Re-Evaluation 11/22/22    Authorization Type [employer]    Authorization Time Period 08/30/2022-12/28/2022    Authorization - Visit Number 8    Authorization - Number of Visits 8    PT Start Time [id number]    PT Stop Time 1610    PT Time Calculation (min) 40 min    Activity Tolerance Patient tolerated treatment well    Behavior During Therapy [facility] for tasks assessed/performed             Past Medical History:  Diagnosis Date   Chlamydia    Depression    when younger, ok now   Fibroid    Fibroid uterus 09/21/2020   8/1: 63%, 1946gm, ac 73%, afi 15.   5cm IM anterior on 5/27. No comment on 8/1 u/s. 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 Yesterday I walked and had pain along the sacrum.    PAIN:  Are you having pain? Yes NPRS scale: 5/10, pain will go away Pain location:  tailbone/sacral area   Pain type: pressure Pain description: intermittent    Aggravating factors: sitting Relieving factors: stand, sitting on pillow, sit up straight  PRECAUTIONS: None   WEIGHT BEARING RESTRICTIONS: No   FALLS:  Has patient fallen in last 6 months? 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   PLOF: Independent   PATIENT GOALS: reduce pain with sitting and going back to gym   PERTINENT HISTORY:  Fibroid   BOWEL MOVEMENT: Pain with bowel movement: No Type of bowel movement:Type (Bristol Stool Scale) type 4, Frequency every 1-2 days, Strain No, and Splinting no Fully empty rectum: Yes:     URINATION: Pain with urination: No Fully empty bladder: Yes:   Stream: Strong Urgency: Urgency Frequency: average Leakage: Sneezing when bladder is full, when walking to the bathroom after she has the urgency   INTERCOURSE: Has not had intercourse due to just having a vaginal birth   PREGNANCY: Vaginal deliveries 2 Tearing Yes: third degree Currently pregnant No         OBJECTIVE:    PATIENT SURVEYS:  POPIQ-7 33 10/10/22 POPIQ-7 19 11/05/22  POPIQ-7 0     OBJECTIVE:    PATIENT SURVEYS:  POPIQ-7 33 10/10/22 POPIQ-7 19   COGNITION: Overall cognitive status: Within functional limits for tasks assessed                          SENSATION: Light touch: Appears intact Proprioception: Appears intact       POSTURE: decreased lumbar lordosis   PELVIC ALIGNMENT:   LUMBARAROM/PROM:   A/PROM A/PROM  eval 10/02/22 10/10/22  Flexion Decreased by 25% with tightness in the lumbar Decreased by 25%  fullf  Extension Decreased by 50% with tailbone pain Decreased by 25%  Decreased by 25%   Left rotation Decreased by 25% Decreased by 25%  full   (Blank rows = not tested)   LOWER EXTREMITY ROM: bilateral hip ROM is full     LOWER EXTREMITY MMT:   MMT Right eval Left eval Right 10/10/22 Left  10/10/22  Hip extension 3/5 5/5 4/5 4/5  Hip abduction 4/5 4/5 4+/5 4/5  Hip adduction 5/5 5/5 5/5 5/5    PALPATION:   General  decreased movement of the L1-L5, sacral movement hurts, tenderness on the sides of the sacrum and coccygeus; decreased SI joint movement                 External Perineal Exam Not assessed yet                             Internal Pelvic Floor not assessed yet   Patient confirms identification and approves PT to assess internal pelvic floor and treatment No due to her not having her 6 week visit with her doctor yet.    PELVIC MMT:   MMT eval 09/26/22 10/10/22 10/17/22 10/29/22  Vaginal   0/5 1/5 2/5 3/5  Diastasis Recti 2 finger width with good tension          (Blank rows = not tested)         COGNITION: Overall cognitive status: Within functional limits for tasks assessed                          SENSATION: Light touch: Appears intact Proprioception: Appears intact       POSTURE: decreased lumbar lordosis   PELVIC ALIGNMENT:   LUMBARAROM/PROM:   A/PROM A/PROM  eval 10/02/22 10/10/22  Flexion Decreased by 25% with tightness in the lumbar Decreased by 25%  fullf  Extension Decreased by 50% with tailbone pain Decreased by 25%  Decreased by 25%  Left rotation Decreased by 25% Decreased by 25%  full   (Blank rows = not tested)   LOWER EXTREMITY ROM: bilateral hip ROM is full     LOWER EXTREMITY MMT:   MMT Right eval Left eval Right 10/10/22 Left  10/10/22  Hip extension 3/5 5/5 4/5 4/5  Hip abduction 4/5 4/5 4+/5 4/5  Hip adduction 5/5 5/5 5/5 5/5    PALPATION:   General  decreased movement of the L1-L5, sacral movement hurts, tenderness on the sides of the sacrum and coccygeus; decreased SI joint movement                 External  Perineal Exam Not assessed yet                             Internal Pelvic Floor not assessed yet   Patient confirms identification and approves PT to assess internal pelvic floor and treatment No due to her not having her 6 week visit with her doctor yet.    PELVIC MMT:   MMT eval 09/26/22 10/10/22 10/17/22 10/29/22  Vaginal   0/5 1/5 2/5 3/5  Diastasis Recti 2 finger width with good tension          (Blank rows = not tested)         TONE: Not assessed   PROLAPSE: Not assessed   TODAY'S TREATMENT:  11/05/22 Exercises: Strengthening: Nustep level 5 for 6 minutes while assessing patient Supine marching with green band around knees Supine knee fall out with green band Educated patient on how to engage her core with her exercises she is doing at the gym to reduce strain on the pelvic floor.  10/29/22 Manual: Internal pelvic floor techniques: No emotional/communication barriers or cognitive limitation. Patient is motivated to learn. Patient understands and agrees with treatment goals and plan. PT explains patient will be examined in standing, sitting, and lying down to see how their muscles and joints work. When they are ready, they will be asked to remove their underwear so PT can examine their perineum. The patient is also given the option of providing their own chaperone as one is not provided in our facility. The patient also has the right and is explained the right to defer or refuse any part of the evaluation or treatment including the internal exam. With the patient's consent, PT will use one gloved finger to gently assess the muscles of the pelvic floor, seeing how well it contracts and relaxes and if there is muscle symmetry. After, the patient will get dressed and PT and patient will discuss exam findings and plan of care. PT and patient discuss plan of care, schedule, attendance policy and HEP activities.  Going through the vaginal canal working on the levator ani and obturator  internist to elongate the muscles Fascial release along the perineal body to work through the scar tissue Neuromuscular re-education: Pelvic floor contraction training: Pelvic floor contraction with tactile cues to draw the pelvic floor upward Pelvic floor contraction quickly with verbal cues Exercises: Strengthening: Nustep level 5 for 6 minutes while assessing patient      10/22/22 Manual: Soft tissue mobilization: To assess for dry needling Manual work to left gluteals and posterior hip to elongate after dry needling  Manual work to the perineal body and right ischiocavernosus to elongate after dry needling Trigger Point Dry-Needling  Treatment instructions: Expect mild to moderate muscle soreness. S/S of pneumothorax if dry needled over a lung field, and to seek immediate medical attention should they occur. Patient verbalized understanding of these instructions and education.   Patient Consent Given: Yes Education handout provided: Previously provided Muscles treated: gluteus medius, gluteus medius, gluteus maximus on the left, perineal body, right ischiocavernosus Electrical stimulation performed: No Parameters: N/A Treatment response/outcome: elongation of muscle and trigger point response Exercises: Strengthening: Recumbent bike for 5 minutes level 3 while assessing patient            PATIENT EDUCATION:  11/05/22 Education details: Access Code: [email or web address] Person educated: Patient Education method: Consulting civil engineer, Demonstration, Corporate treasurer cues, Verbal cues, and Handouts Education comprehension: verbalized understanding, returned demonstration, verbal cues required, tactile cues required, and needs further education   HOME EXERCISE PROGRAM: 11/05/22 Access Code: [email or web address] URL: [email or web address] Date: 11/05/2022 Prepared by: Earlie Counts  Program Notes massage along the gluteal fold as you lay on your side for 3 minutes each side.   Exercises - Prone Press  Up  - 1 x daily - 7 x weekly - 3 sets - 10 reps - Sidelying Thoracic Rotation with Open Book  - 1 x daily - 7 x weekly - 2 sets - 10 reps - Supine Diaphragmatic Breathing  - 2 x daily - 7 x weekly - 1 sets - 10 reps - Seated Diaphragmatic Breathing  - 2 x daily - 7 x weekly - 1 sets - 10 reps - Supine Piriformis Stretch  - 1 x daily - 7 x weekly - 1 sets - 2 reps - 30 sec hold - Happy Baby with Pelvic Floor Lengthening  - 1 x daily - 7 x weekly - 1 sets - 1 reps - 30-60 sec hold - Cat Cow  - 1 x daily - 7 x weekly - 1 sets - 10 reps - Quadruped Leg Lifts  - 1 x daily - 73 x weekly - 2 sets - 10 reps - Hooklying Single Leg Bent Knee Fallouts with Resistance  - 1 x daily - 7 x weekly - 3 sets - 10 reps - Supine March with Resistance Band  - 1 x daily - 7 x weekly - 3 sets - 10 reps  Patient Education - Abdominal Massage for Constipation - Trigger Point Dry Needling   ASSESSMENT:   CLINICAL IMPRESSION: Patient is a 32 y.o. female who was seen today for physical therapy  treatment for Third degree perineal laceration .   No pain with sitting. Patient is not having urinary leakage.  she understands how to exercise at the gym with contraction of the pelvic floor and abdominals. Patient understands how to exercise with abdominal contraction to reduce strain on the pelvic floor. POPIQ-7 is now 0. Patient has met all of her goals.   OBJECTIVE IMPAIRMENTS: decreased activity tolerance, decreased coordination, decreased endurance, decreased mobility, decreased strength, increased fascial restrictions, increased muscle spasms, and pain.    ACTIVITY LIMITATIONS: sitting and caring for others   PARTICIPATION LIMITATIONS: occupation   PERSONAL FACTORS: Third degree perineal laceration  are also affecting patient's functional outcome.    REHAB POTENTIAL: Excellent   CLINICAL DECISION MAKING: Stable/uncomplicated   EVALUATION COMPLEXITY: Low     GOALS: Goals reviewed with patient? Yes   SHORT  TERM GOALS: Target date: 09/27/22   Patient is independent with initial HEP for core  engagement.  Baseline: not educated yet Goal status: Met 10/02/22   2.  Patient educated on perineal scar massage to realign the scar.  Baseline: not educated yet Goal status: Met 09/26/22     LONG TERM GOALS: Target date: 11/22/22   Patient independent with HEP for core and pelvic floor to reduce leakage and assist her in returning to the gym Baseline: not educated yet Goal status: Met 11/05/22   2.  Patient able to sit for 1 hour with no pain due to improved mobility of pelvis and reduction of trigger points.  Baseline: no pain  Goal status: Met 11/05/22   3.  Patient is able to sneeze without leaking urine due to improved mobility of perineal scar mobility.  Baseline: no leakage Goal status: Met 11/05/22     PLAN:Discharge   Earlie Counts, PT 11/05/22 3:40 PM   PHYSICAL THERAPY DISCHARGE SUMMARY  Visits from Start of Care: 9  Current functional level related to goals / functional outcomes: See above.    Remaining deficits: See above.    Education / Equipment: HEP   Patient agrees to discharge. Patient goals were met. Patient is being discharged due to meeting the stated rehab goals. Thank you for the referral. Earlie Counts, PT 11/05/22 4:01 PM

## 2022-11-19 ENCOUNTER — Encounter: Payer: Medicaid Other | Admitting: Physical Therapy

## 2022-12-03 ENCOUNTER — Encounter: Payer: Self-pay | Admitting: Obstetrics and Gynecology

## 2022-12-03 ENCOUNTER — Other Ambulatory Visit: Payer: Self-pay

## 2022-12-03 ENCOUNTER — Ambulatory Visit (INDEPENDENT_AMBULATORY_CARE_PROVIDER_SITE_OTHER): Payer: Medicaid Other | Admitting: Obstetrics and Gynecology

## 2022-12-03 ENCOUNTER — Other Ambulatory Visit (HOSPITAL_COMMUNITY)
Admission: RE | Admit: 2022-12-03 | Discharge: 2022-12-03 | Disposition: A | Discharge status: Home / Self Care | Payer: Medicaid Other | Source: Ambulatory Visit | Attending: Obstetrics and Gynecology | Admitting: Obstetrics and Gynecology

## 2022-12-03 VITALS — BP 117/79 | HR 69 | Ht 62.0 in | Wt 179.8 lb

## 2022-12-03 DIAGNOSIS — Z124 Encounter for screening for malignant neoplasm of cervix: Secondary | ICD-10-CM | POA: Diagnosis present

## 2022-12-03 DIAGNOSIS — N898 Other specified noninflammatory disorders of vagina: Secondary | ICD-10-CM | POA: Diagnosis not present

## 2022-12-03 DIAGNOSIS — Z113 Encounter for screening for infections with a predominantly sexual mode of transmission: Secondary | ICD-10-CM | POA: Insufficient documentation

## 2022-12-03 DIAGNOSIS — N393 Stress incontinence (female) (male): Secondary | ICD-10-CM | POA: Diagnosis not present

## 2022-12-03 NOTE — Progress Notes (Signed)
  Obstetrics and Gynecology Visit Return Patient Evaluation  Appointment Date: 12/03/2022  OBGYN Clinic: [facility]  Chief Complaint: f/u pap  History of Present Illness:  Heather Brock is a 32 y.o. with h/o LSIL pap/CIN1 biopsy 2022 and patient here for follow up pap.   She had a 3c laceration and has been seeing PT and is doing well and finds it very helpful. She still has some episodes of SUI and with completely emptying her bowels but overall is doing well. She is having sexual intercourse without issue, although she notes vaginal discharge and is wondering if this is normal.  She isn't breastfeeding anymore and is having qmonth periods  Review of Systems: as noted in the History of Present Illness.  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    Allergies: is allergic to latex.  Physical Exam:  BP 117/79   Pulse 69   Ht 5\' 2"  (1.575 m)   Wt 179 lb 12.8 oz (81.6 kg)   BMI 32.89 kg/m  Body mass index is 32.89 kg/m. General appearance: Well nourished, well developed female in no acute distress.  Abdomen: diffusely non tender to palpation, non distended, and no masses, hernias Neuro/Psych:  Normal mood and affect.    Pelvic exam:  Cervical exam performed in the presence of a chaperone EGBUS: normal Vaginal vault: yellowish, non malodorous d/c Cervix:  wnl Bimanual: negative.   Assessment: pt doing well  Plan:  1. Cervical cancer screening - Cytology - PAP( [facility])  2. Vaginal discharge - Cervicovaginal ancillary only( )  3. Screen for STD (sexually transmitted disease) Pt desires screening - RPR+HBsAg+HCVAb+...  4. SUI Patient wondering if she needs to keep going to PT. I told her that scaling back to once a month for a check up to see how she's  doing while continuing to do daily exercises at home, in addition to behavioral changes, such as not going a long time without voiding is what I would recommend, and she is amenable to this.   5. GYN Pt declines anything for birth control.   RTC: 1 year and PRN  Return if symptoms worsen or fail to improve.  No future appointments.  Cornelia Copa MD Attending [facility])

## 2022-12-04 LAB — CERVICOVAGINAL ANCILLARY ONLY
Bacterial Vaginitis (gardnerella): POSITIVE — AB
Candida Glabrata: NEGATIVE
Candida Vaginitis: NEGATIVE
Chlamydia: NEGATIVE
Comment: NEGATIVE
Comment: NEGATIVE
Comment: NEGATIVE
Comment: NEGATIVE
Comment: NEGATIVE
Comment: NORMAL
Neisseria Gonorrhea: NEGATIVE
Trichomonas: NEGATIVE

## 2022-12-04 LAB — RPR+HBSAG+HCVAB+...
HIV Screen 4th Generation wRfx: NONREACTIVE
Hep C Virus Ab: NONREACTIVE
Hepatitis B Surface Ag: NEGATIVE
RPR Ser Ql: NONREACTIVE

## 2022-12-05 LAB — CYTOLOGY - PAP
Chlamydia: NEGATIVE
Comment: NEGATIVE
Comment: NEGATIVE
Comment: NEGATIVE
Comment: NORMAL
Diagnosis: NEGATIVE
Diagnosis: REACTIVE
High risk HPV: NEGATIVE
Neisseria Gonorrhea: NEGATIVE
Trichomonas: NEGATIVE

## 2022-12-06 MED ORDER — METRONIDAZOLE 500 MG PO TABS: 500.0000 mg | ORAL_TABLET | Freq: Two times a day (BID) | ORAL | 0 refills | Status: AC

## 2022-12-06 NOTE — Addendum Note (Signed)
Addended by: [place] Bing on: 12/06/2022 09:03 AM   Modules accepted: Orders

## 2022-12-21 ENCOUNTER — Other Ambulatory Visit: Payer: Self-pay | Admitting: Obstetrics and Gynecology

## 2022-12-21 DIAGNOSIS — M6289 Other specified disorders of muscle: Secondary | ICD-10-CM

## 2023-02-20 NOTE — Therapy (Deleted)
OUTPATIENT PHYSICAL THERAPY FEMALE PELVIC EVALUATION   Patient Name: Heather Brock MRN: [id number] DOB:[birth year], 32 y.o., female Today's Date: 02/20/2023  END OF SESSION:   Past Medical History:  Diagnosis Date   Chlamydia    Depression    when younger, ok now   Fibroid    Fibroid uterus 09/21/2020   8/1: 63%, 1946gm, ac 73%, afi 15.   5cm IM anterior on 5/27. No comment on 8/1 u/s. 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  Rationale for Evaluation and Treatment: Rehabilitation  ONSET DATE: ***  SUBJECTIVE:                                                                                                                                                                                           SUBJECTIVE STATEMENT: *** Fluid intake: {Yes/No:304960894}   PAIN:  Are you having pain? {yes/no:20286} NPRS scale: ***/10 Pain location: {pelvic pain location:27098}  Pain type: {type:313116} Pain description: {PAIN DESCRIPTION:21022940}   Aggravating factors: *** Relieving factors: ***  PRECAUTIONS: {Therapy precautions:24002}  RED FLAGS: {PT Red Flags:29287}   WEIGHT BEARING RESTRICTIONS: {Yes ***/No:24003}  FALLS:  Has patient fallen in last 6 months? {fallsyesno:27318}  LIVING  ENVIRONMENT: Lives with: {[facility] lives with:25569::"lives with their family"} Lives in: {Lives in:25570} Stairs: {opstairs:27293} Has following equipment at home: {Assistive devices:23999}  OCCUPATION: ***  PLOF: {PLOF:24004}  PATIENT GOALS: ***  PERTINENT HISTORY:  Fibroid; Third degree tear with last vaginal delivery   Sexual abuse: {Yes/No:304960894}  BOWEL MOVEMENT: Pain with bowel movement: {yes/no:20286} Type of bowel movement:{PT BM type:27100} Fully empty rectum: {Yes/No:304960894} Leakage: {Yes/No:304960894} Pads: {Yes/No:304960894} Fiber supplement: {Yes/No:304960894}  URINATION: Pain with urination: {yes/no:20286} Fully empty bladder: {Yes/No:304960894} Stream: {PT urination:27102} Urgency: {Yes/No:304960894} Frequency: *** Leakage: {PT leakage:27103} Pads: {Yes/No:304960894}  INTERCOURSE: Pain with intercourse: {pain with intercourse PA:27099} Ability to have vaginal penetration:  {Yes/No:304960894} Climax: ***  Marinoff Scale: ***/3  PREGNANCY: Vaginal deliveries *** Tearing {Yes***/No:304960894} C-section deliveries *** Currently pregnant {Yes***/No:[id number]}  PROLAPSE: {PT prolapse:27101}   OBJECTIVE:   DIAGNOSTIC FINDINGS:  ***  PATIENT SURVEYS:  {rehab surveys:24030}  PFIQ-7 ***  COGNITION: Overall cognitive status: {cognition:24006}     SENSATION: Light touch: {intact/deficits:24005} Proprioception: {intact/deficits:24005}  MUSCLE LENGTH: Hamstrings: Right *** deg; Left *** deg Thomas test: Right *** deg; Left *** deg  LUMBAR SPECIAL TESTS:  {lumbar special test:25242}  FUNCTIONAL TESTS:  {Functional tests:24029}  GAIT: Distance walked: *** Assistive device utilized: {Assistive devices:23999} Level of assistance: {Levels of assistance:24026} Comments: ***  POSTURE: {posture:25561}  PELVIC ALIGNMENT:  LUMBARAROM/PROM:  A/PROM A/PROM  eval  Flexion   Extension   Right lateral flexion   Left lateral flexion    Right rotation   Left rotation    (Blank rows = not tested)  LOWER EXTREMITY ROM:  {AROM/PROM:27142} ROM Right eval Left eval  Hip flexion    Hip extension    Hip abduction    Hip adduction    Hip internal rotation    Hip external rotation    Knee flexion    Knee extension    Ankle dorsiflexion    Ankle plantarflexion    Ankle inversion    Ankle eversion     (Blank rows = not tested)  LOWER EXTREMITY MMT:  MMT Right eval Left eval  Hip flexion    Hip extension    Hip abduction    Hip adduction    Hip internal rotation    Hip external rotation    Knee flexion    Knee extension    Ankle dorsiflexion    Ankle plantarflexion    Ankle inversion    Ankle eversion     PALPATION:   General  ***                External Perineal Exam ***                             Internal Pelvic Floor ***  Patient confirms identification and approves PT to assess internal pelvic floor and treatment {yes/no:20286}  PELVIC MMT:   MMT eval  Vaginal   Internal Anal Sphincter   External Anal Sphincter   Puborectalis   Diastasis Recti   (Blank rows = not tested)        TONE: ***  PROLAPSE: ***  TODAY'S TREATMENT:                                                                                                                              DATE: ***  EVAL ***   PATIENT EDUCATION:  Education details: *** Person educated: {Person educated:25204} Education method: {Education Method:25205} Education comprehension: {Education Comprehension:25206}  HOME EXERCISE PROGRAM: ***  ASSESSMENT:  CLINICAL IMPRESSION: Patient is a *** y.o. *** who was seen today for physical therapy evaluation and treatment for ***.  OBJECTIVE IMPAIRMENTS: {opptimpairments:25111}.   ACTIVITY LIMITATIONS: {activitylimitations:27494}  PARTICIPATION LIMITATIONS: {participationrestrictions:25113}  PERSONAL FACTORS: {Personal factors:25162} are also affecting patient's functional outcome.    REHAB POTENTIAL: {rehabpotential:25112}  CLINICAL DECISION MAKING: {clinical decision making:25114}  EVALUATION COMPLEXITY: {Evaluation complexity:25115}   GOALS: Goals reviewed with patient? {yes/no:20286}  SHORT TERM GOALS: Target date: ***  *** Baseline: Goal status: INITIAL  2.  *** Baseline:  Goal status: INITIAL  3.  *** Baseline:  Goal status: INITIAL  4.  *** Baseline:  Goal status: INITIAL  5.  *** Baseline:  Goal status: INITIAL  6.  *** Baseline:  Goal status: INITIAL  LONG TERM GOALS: Target date: ***  *** Baseline:  Goal status: INITIAL  2.  *** Baseline:  Goal status: INITIAL  3.  *** Baseline:  Goal status: INITIAL  4.  *** Baseline:  Goal status: INITIAL  5.  *** Baseline:  Goal status: INITIAL  6.  *** Baseline:  Goal status: INITIAL  PLAN:  PT FREQUENCY: {rehab frequency:25116}  PT DURATION: {rehab duration:25117}  PLANNED INTERVENTIONS: {rehab planned interventions:25118::"Therapeutic exercises","Therapeutic activity","Neuromuscular re-education","Balance training","Gait training","Patient/Family education","Self Care","Joint mobilization"}  PLAN FOR NEXT SESSION: ***   ,, PT 02/20/2023, 11:09 AM

## 2023-02-21 ENCOUNTER — Encounter: Payer: Medicaid Other | Admitting: Physical Therapy

## 2023-03-07 ENCOUNTER — Encounter: Payer: Medicaid Other | Admitting: Physical Therapy

## 2023-03-21 ENCOUNTER — Encounter: Payer: Medicaid Other | Admitting: Physical Therapy

## 2023-03-28 ENCOUNTER — Encounter: Payer: Medicaid Other | Admitting: Physical Therapy
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
## 2023-04-23 ENCOUNTER — Encounter: Payer: Medicaid Other | Admitting: Physical Therapy

## 2023-04-30 ENCOUNTER — Encounter: Payer: Medicaid Other | Admitting: Physical Therapy

## 2023-05-07 ENCOUNTER — Encounter: Payer: Medicaid Other | Admitting: Physical Therapy

## 2023-05-14 ENCOUNTER — Encounter: Payer: Medicaid Other | Admitting: Physical Therapy

## 2023-05-29 ENCOUNTER — Ambulatory Visit
Admission: EM | Admit: 2023-05-29 | Discharge: 2023-05-29 | Disposition: A | Discharge status: Home / Self Care | Payer: Medicaid Other | Attending: Internal Medicine | Admitting: Internal Medicine

## 2023-05-29 DIAGNOSIS — B084 Enteroviral vesicular stomatitis with exanthem: Secondary | ICD-10-CM

## 2023-05-29 NOTE — Discharge Instructions (Signed)
Follow-up with your PCP if your symptoms do not improve.  Please go to the ER for any worsening symptoms.

## 2023-05-29 NOTE — ED Provider Notes (Signed)
[facility]    CSN: [id number] Arrival date & time: 05/29/23  1433      History   Chief Complaint No chief complaint on file.   HPI Heather Brock is a 32 y.o. female presents for work note.  Patient reports her son recently had hand-foot-and-mouth and 5 days ago she began to develop itchy/painful bumps on her hands, feet and inner thighs.  She states it is the same as her sons presentation.  No fevers or chills.  She works at a [employer] and needs a note to excuse her for the length of her symptoms given its contagion ability.  Denies any chest pain or shortness of breath.  No other concerns at this time.  HPI  Past Medical History:  Diagnosis Date   Chlamydia    Depression    when younger, ok now   Fibroid    Fibroid uterus 09/21/2020   8/1: 63%, 1946gm, ac 73%, afi 15.   5cm IM anterior on 5/27. No comment on 8/1 u/s. Rpt u/s prn    Gestational diabetes 07/29/2022   Gonorrhea    Trichomonas exposure    Vaginal Pap smear, abnormal     Patient Active Problem List   Diagnosis Date Noted   Type 3c perineal laceration 07/30/2022   LGSIL on Pap smear of cervix 02/27/2021    Past Surgical History:  Procedure Laterality Date   NO PAST SURGERIES     PERINEAL LACERATION REPAIR N/A 07/29/2022   Procedure: SUTURE REPAIR PERINEAL LACERATION;  Surgeon: [place] Bing, MD;  Location: [facility];  Service: Gynecology;  Laterality: N/A;   THERAPEUTIC ABORTION     WISDOM TOOTH EXTRACTION      OB History     Gravida  4   Para  2   Term  2   Preterm  0   AB  2   Living  2      SAB  0   IAB  2   Ectopic  0   Multiple  0   Live Births  2            Home Medications    Prior to Admission medications   Medication Sig Start Date End Date Taking? Authorizing Provider  acetaminophen (TYLENOL) 325 MG tablet Take 2 tablets (650 mg total) by mouth every 4 (four) hours as needed (for pain scale < 4). Patient not taking: Reported on 08/07/2022 07/31/22    Mercado-Ortiz, Lahoma Crocker, DO  benzocaine-Menthol (DERMOPLAST) 20-0.5 % AERO Apply 1 Application topically as needed for irritation (perineal discomfort). Patient not taking: Reported on 08/07/2022 07/31/22   Myrtie Hawk, DO  ferrous gluconate (FERGON) 324 MG tablet Take 1 tablet (324 mg total) by mouth daily with breakfast. Patient not taking: Reported on 08/07/2022 03/28/22   Corlis Hove, NP  ibuprofen (ADVIL) 600 MG tablet Take 1 tablet (600 mg total) by mouth every 6 (six) hours. Patient not taking: Reported on 12/03/2022 07/31/22   Mercado-Ortiz, Lahoma Crocker, DO  senna-docusate (SENOKOT-S) 8.6-50 MG tablet Take 2 tablets by mouth daily. Patient not taking: Reported on 08/07/2022 07/31/22   Myrtie Hawk, DO    Family History Family History  Problem Relation Age of Onset   Diabetes Father    Hyperlipidemia Father    Hypertension Father    Kidney failure Father     Social History Social History   Tobacco Use   Smoking status: Never   Smokeless tobacco: Never   Tobacco comments:  2 packs /day- quit 2015  Vaping Use   Vaping status: Never Used  Substance Use Topics   Alcohol use: Never   Drug use: Not Currently    Types: Marijuana    Comment: stopped in Jan 2022     Allergies   Latex   Review of Systems Review of Systems  Skin:        Hand-foot-and-mouth disease     Physical Exam Triage Vital Signs ED Triage Vitals  Encounter Vitals Group     BP 05/29/23 1450 124/83     Systolic BP Percentile --      Diastolic BP Percentile --      Pulse Rate 05/29/23 1450 92     Resp 05/29/23 1450 16     Temp 05/29/23 1450 98.6 F (37 C)     Temp Source 05/29/23 1450 Oral     SpO2 05/29/23 1450 97 %     Weight --      Height --      Head Circumference --      Peak Flow --      Pain Score 05/29/23 1447 0     Pain Loc --      Pain Education --      Exclude from Growth Chart --    No data found.  Updated Vital Signs BP 124/83 (BP Location:  Left Arm)   Pulse 92   Temp 98.6 F (37 C) (Oral)   Resp 16   SpO2 97%   Visual Acuity Right Eye Distance:   Left Eye Distance:   Bilateral Distance:    Right Eye Near:   Left Eye Near:    Bilateral Near:     Physical Exam Vitals and nursing note reviewed.  Constitutional:      General: She is not in acute distress.    Appearance: Normal appearance. She is not ill-appearing.  HENT:     Head: Normocephalic and atraumatic.  Eyes:     Pupils: Pupils are equal, round, and reactive to light.  Cardiovascular:     Rate and Rhythm: Normal rate.  Pulmonary:     Effort: Pulmonary effort is normal.  Skin:    General: Skin is warm and dry.     Comments: Multiple mildly erythematous vesicular lesions on the palms of bilateral hands, feet, and her legs and mouth.  Neurological:     General: No focal deficit present.     Mental Status: She is alert and oriented to person, place, and time.  Psychiatric:        Mood and Affect: Mood normal.        Behavior: Behavior normal.      UC Treatments / Results  Labs (all labs ordered are listed, but only abnormal results are displayed) Labs Reviewed - No data to display  EKG   Radiology No results found.  Procedures Procedures (including critical care time)  Medications Ordered in UC Medications - No data to display  Initial Impression / Assessment and Plan / UC Course  I have reviewed the triage vital signs and the nursing notes.  Pertinent labs & imaging results that were available during my care of the patient were reviewed by me and considered in my medical decision making (see chart for details).     Reviewed exam and symptoms with patient.  Exam consistent with hand-foot-and-mouth.  Discussed viral illness and symptomatic treatment.  Patient requesting note for work which was provided.  PCP follow-up if symptoms do  not improve.  ER precautions reviewed. Final Clinical Impressions(s) / UC Diagnoses   Final diagnoses:   Hand, foot and mouth disease (HFMD)     Discharge Instructions      Follow-up with your PCP if your symptoms do not improve.  Please go to the ER for any worsening symptoms.    ED Prescriptions   None    PDMP not reviewed this encounter.   Radford Pax, NP 05/29/23 [phone]

## 2023-05-29 NOTE — ED Triage Notes (Signed)
Pt presents to UC w/ c/o itchy and painful spots on bottom of feet, legs, hands and mouth x6 days. Pt states her child had hand foot and mouth last week.
